# Patient Record
Sex: Male | Born: 1970 | Race: White | Hispanic: No | Marital: Married | Smoking: Never smoker
Health system: Southern US, Community
[De-identification: ages and names within clinical notes are randomized; demographics above are authoritative.]

## PROBLEM LIST (undated history)

## (undated) DIAGNOSIS — L509 Urticaria, unspecified: Secondary | ICD-10-CM

## (undated) DIAGNOSIS — R42 Dizziness and giddiness: Secondary | ICD-10-CM

## (undated) HISTORY — DX: Urticaria, unspecified: L50.9

## (undated) HISTORY — PX: VASECTOMY: SHX75

## (undated) HISTORY — DX: Dizziness and giddiness: R42

---

## 1999-01-15 ENCOUNTER — Encounter: Payer: Self-pay | Admitting: Emergency Medicine

## 1999-01-15 ENCOUNTER — Ambulatory Visit (HOSPITAL_COMMUNITY): Admission: RE | Admit: 1999-01-15 | Discharge: 1999-01-15 | Payer: Self-pay | Admitting: Emergency Medicine

## 1999-01-15 ENCOUNTER — Emergency Department (HOSPITAL_COMMUNITY): Admission: EM | Admit: 1999-01-15 | Discharge: 1999-01-15 | Payer: Self-pay | Admitting: Emergency Medicine

## 2003-12-28 ENCOUNTER — Ambulatory Visit (HOSPITAL_COMMUNITY): Admission: RE | Admit: 2003-12-28 | Discharge: 2003-12-28 | Payer: Self-pay | Admitting: Family Medicine

## 2011-10-31 ENCOUNTER — Other Ambulatory Visit: Payer: Self-pay | Admitting: Physician Assistant

## 2011-10-31 ENCOUNTER — Ambulatory Visit
Admission: RE | Admit: 2011-10-31 | Discharge: 2011-10-31 | Disposition: A | Discharge status: Home / Self Care | Payer: BC Managed Care – PPO | Source: Ambulatory Visit | Attending: Physician Assistant | Admitting: Physician Assistant

## 2011-11-28 ENCOUNTER — Ambulatory Visit
Admission: RE | Admit: 2011-11-28 | Discharge: 2011-11-28 | Disposition: A | Discharge status: Home / Self Care | Payer: BC Managed Care – PPO | Source: Ambulatory Visit | Attending: Physician Assistant | Admitting: Physician Assistant

## 2013-07-13 ENCOUNTER — Ambulatory Visit: Payer: Self-pay | Admitting: Interventional Cardiology

## 2013-10-05 IMAGING — CR DG CHEST 2V
2 series · 2 of 2 positions shown · non-contrast
Comparison: None.

CLINICAL DATA: Chest pain

CHEST - 2 VIEW

[view not recorded (1 of 2)]
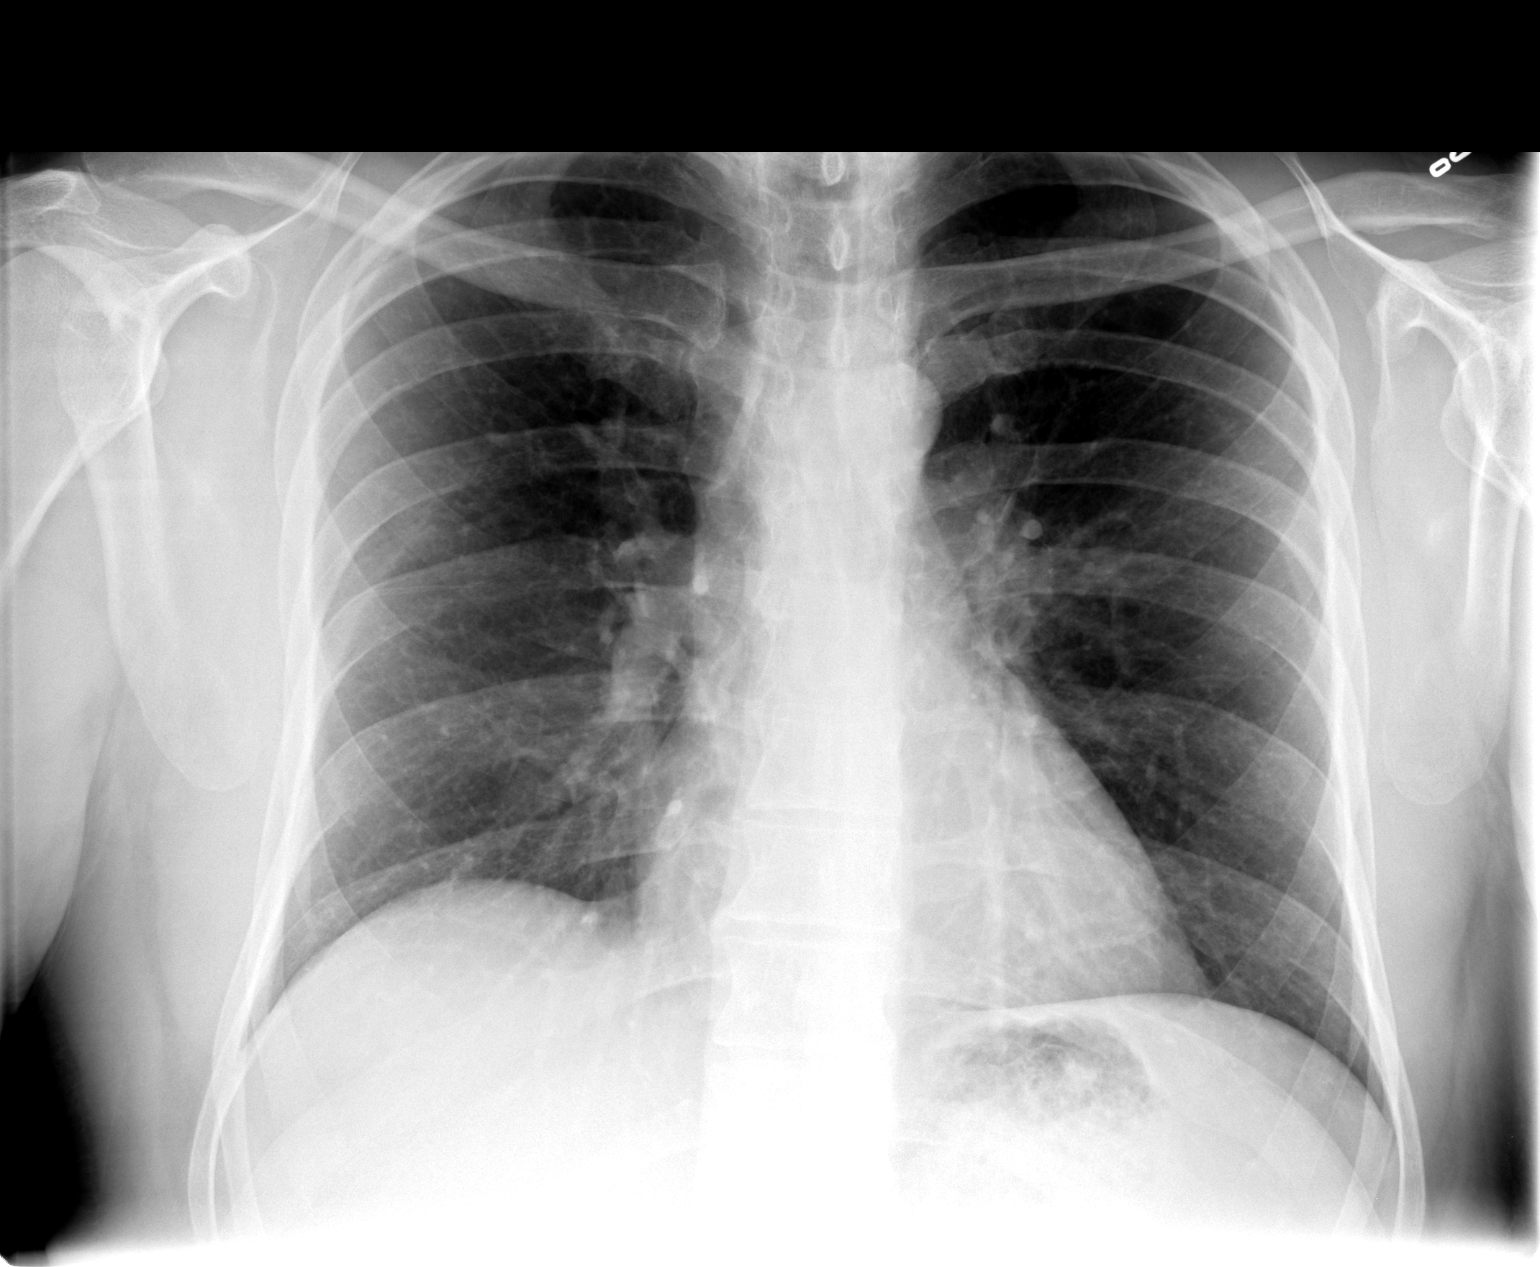

[view not recorded (2 of 2)]
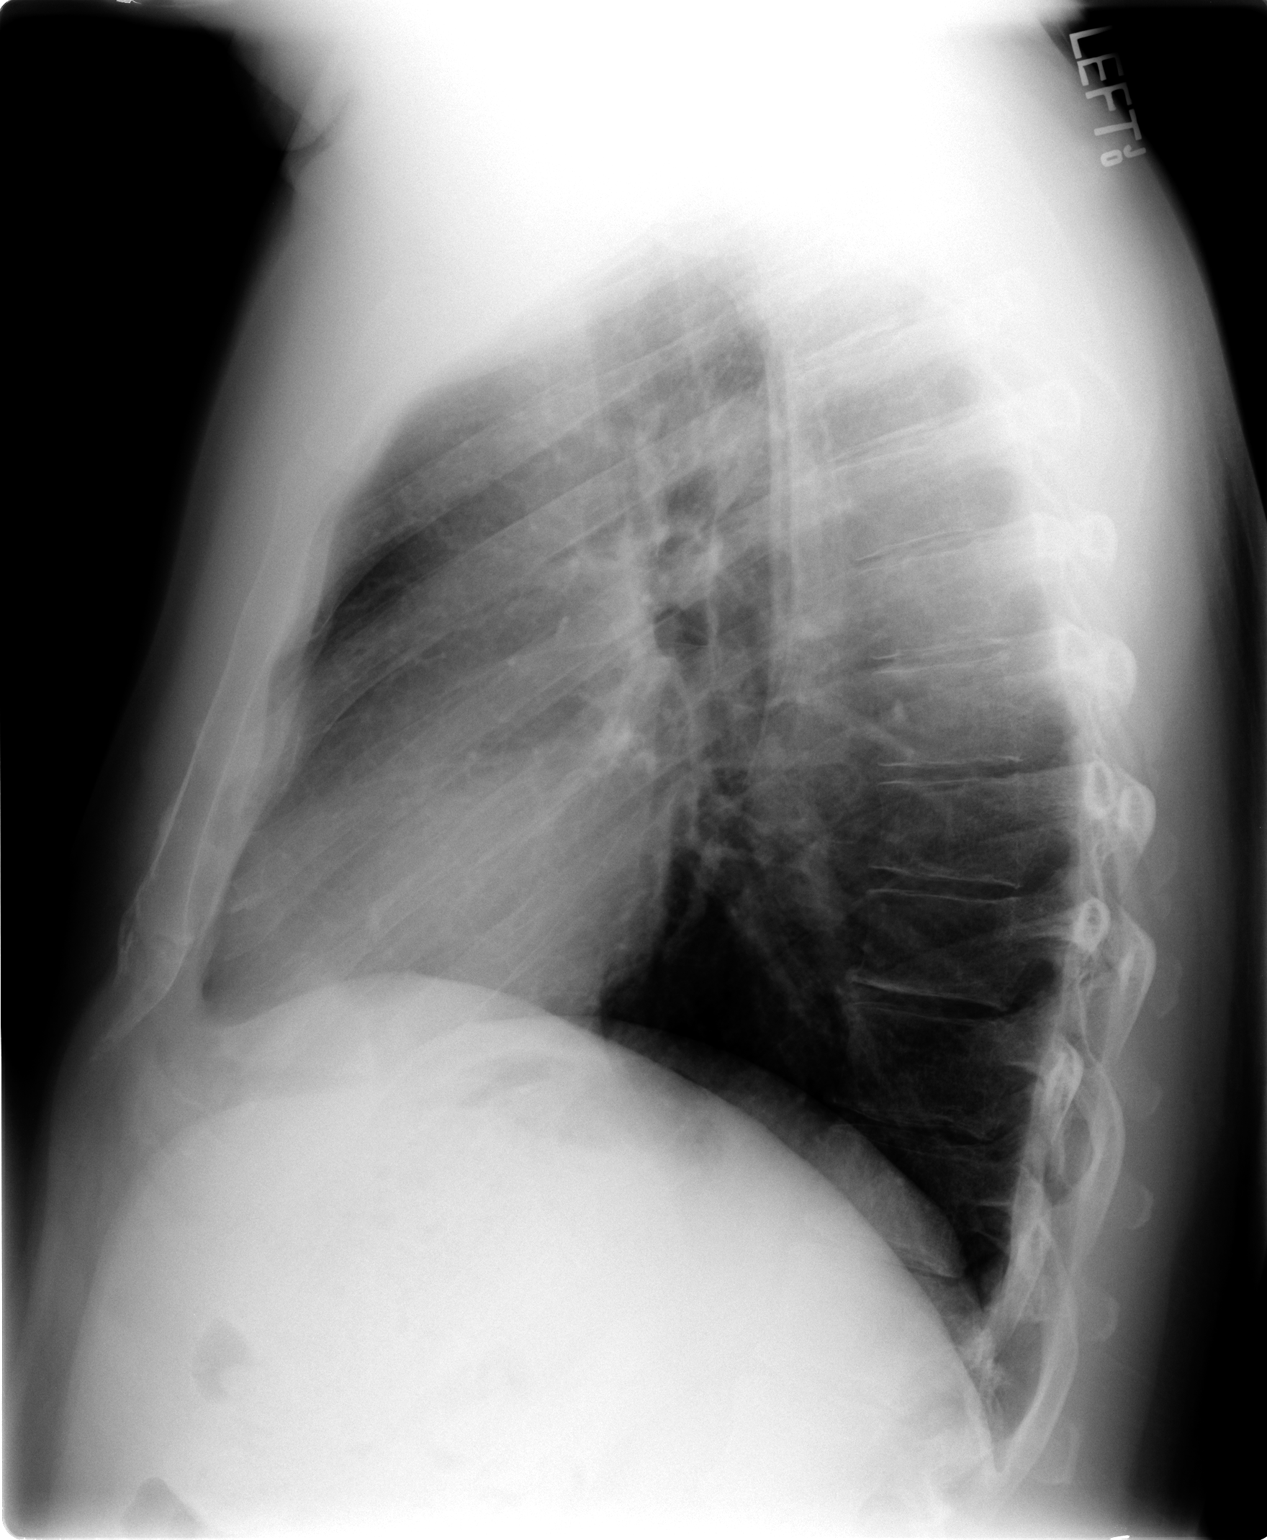

[2 of 2 positions shown; findings below may reference images not displayed]

FINDINGS: Heart and mediastinal contours are within normal limits.
The lung fields appear clear with no focal infiltrates or signs of
congestive failure noted. No pleural fluid or significant
peribronchial cuffing is seen.

Bony structures appear intact.
IMPRESSION: No worrisome focal or acute cardiopulmonary abnormalities seen.

## 2013-12-11 ENCOUNTER — Encounter: Payer: Self-pay | Admitting: *Deleted

## 2013-12-12 ENCOUNTER — Ambulatory Visit (INDEPENDENT_AMBULATORY_CARE_PROVIDER_SITE_OTHER): Payer: BC Managed Care – PPO | Admitting: Interventional Cardiology

## 2013-12-12 ENCOUNTER — Encounter: Payer: Self-pay | Admitting: Interventional Cardiology

## 2013-12-12 VITALS — BP 141/91 | HR 83 | Ht 74.0 in | Wt 235.0 lb

## 2013-12-12 DIAGNOSIS — R0789 Other chest pain: Secondary | ICD-10-CM

## 2013-12-12 DIAGNOSIS — R9431 Abnormal electrocardiogram [ECG] [EKG]: Secondary | ICD-10-CM | POA: Insufficient documentation

## 2013-12-12 DIAGNOSIS — E669 Obesity, unspecified: Secondary | ICD-10-CM | POA: Insufficient documentation

## 2013-12-12 NOTE — Progress Notes (Signed)
Patient ID: Tyler Kelley, male   DOB: 08-18-1971, [age] y.o.   MRN: [id number]   Date: 12/12/2013 ID: Tyler LombardWilliam B Kelley, DOB 08-18-1971, MRN [id number] PCP: Lolita PatellaEADE,ROBERT ALEXANDER, MD  Reason: Chest discomfort  ASSESSMENT;  1. Chest discomfort, atypical 2. Abnormal EKG with inferior Q waves and T-wave inversion  PLAN:  1. Stress Cardiolite 2. Aspirin 81 mg per day, coated   SUBJECTIVE: Tyler Kelley is a [age] y.o. male who is here for evaluation of a 3 year history of chest pain. 2 half years ago he had severe episodes of chest pain the last "seconds". The discomfort is described as a seizing-type pain that would make it stop in your tracts. He was evaluated a couple of times by his primary physician and no abnormalities were found. An EKG was performed in February 2013 net was normal. The discomfort gradually improved. More recently he has had a very mild/dull discomfort that occurs spontaneously and lasts several minutes before resolving. It is not exertion related. There is no associated orthopnea, PND, dyspnea, palpitation, or other complaints. Visit birth risk factors. He has never smoked, has no history of hypertension, hyperlipidemia, or significant family history of vascular disease.   Allergies  Allergen Reactions  . Azithromycin     Current Outpatient Prescriptions on File Prior to Visit  Medication Sig Dispense Refill  . diphenhydrAMINE (BENADRYL) 25 mg capsule Take 25 mg by mouth every 6 (six) hours as needed.      . loratadine (CLARITIN) 10 MG tablet Take 10 mg by mouth daily.       No current facility-administered medications on file prior to visit.    Past Medical History  Diagnosis Date  . Vertigo   . Urticaria     Past Surgical History  Procedure Laterality Date  . Vasectomy      History   Social History  . Marital Status: Single    Spouse Name: N/A    Number of Children: N/A  . Years of Education: N/A   Occupational History  . Not on file.    Social History Main Topics  . Smoking status: Never Smoker   . Smokeless tobacco: Not on file  . Alcohol Use: Yes  . Drug Use: No  . Sexual Activity: Not on file   Other Topics Concern  . Not on file   Social History Narrative  . No narrative on file    Family History  Problem Relation Age of Onset  . Cancer Mother   . Hypertension Father   . Cancer Maternal Grandmother     ROS: Denies palpitations, syncope, claudication, neurological complaints, abdominal pain, wheezing, asthma, blood clots in legs, pulmonary emboli, alcohol use, drug use, and anemia. Other systems negative for complaints.  OBJECTIVE: BP 141/91  Pulse 83  Ht 6\' 2"  (1.88 m)  Wt 235 lb (106.595 kg)  BMI 30.16 kg/m2,  General: No acute distress, mildly overweight, mostly abdominal HEENT: normal without jaundice or pallor Neck: JVD flat. Carotids absent bruits and 2+ upstroke Chest: Clear Cardiac: Murmur: Normal. Gallop: S4. Rhythm: Regular. Other: Normal Abdomen: Bruit: Absent. Pulsation: Absent Extremities: Edema: Absent. Pulses: 2+ and symmetric in the upper and lower extremities Neuro: Normal Psych: Normal  ECG: Abnormal with sinus rhythm inferior Q waves with inferior T-wave abnormality. This is a change compared with 2013

## 2013-12-12 NOTE — Patient Instructions (Signed)
Your physician recommends that you continue on your current medications as directed. Please refer to the Current Medication list given to you today.  Your physician has requested that you have en exercise stress myoview. For further information please visit [email or web address]. Please follow instruction sheet, as given.  Follow up pending stress test

## 2013-12-27 ENCOUNTER — Telehealth (HOSPITAL_COMMUNITY): Payer: Self-pay

## 2013-12-28 ENCOUNTER — Ambulatory Visit (HOSPITAL_COMMUNITY)
Admission: RE | Admit: 2013-12-28 | Discharge: 2013-12-28 | Disposition: A | Discharge status: Home / Self Care | Payer: BC Managed Care – PPO | Source: Ambulatory Visit | Attending: Interventional Cardiology | Admitting: Interventional Cardiology

## 2013-12-28 ENCOUNTER — Encounter (HOSPITAL_COMMUNITY): Payer: Self-pay

## 2013-12-28 DIAGNOSIS — R0789 Other chest pain: Secondary | ICD-10-CM

## 2013-12-28 DIAGNOSIS — R079 Chest pain, unspecified: Secondary | ICD-10-CM | POA: Insufficient documentation

## 2013-12-29 ENCOUNTER — Encounter: Payer: Self-pay | Admitting: Interventional Cardiology

## 2013-12-30 ENCOUNTER — Encounter (HOSPITAL_COMMUNITY): Payer: Self-pay

## 2013-12-30 ENCOUNTER — Telehealth: Payer: Self-pay | Admitting: Interventional Cardiology

## 2013-12-30 NOTE — Telephone Encounter (Signed)
returned pt call.lmom Dr.Entwistle will return to the office on monday pt gxt is on his desk top for review.once reviewed by him we will call with his recommendations

## 2013-12-30 NOTE — Telephone Encounter (Signed)
New message ° ° ° ° °Want stress test results °

## 2014-01-03 NOTE — Telephone Encounter (Signed)
returned pt call.pt given prelim gxt results.adv him I would call back with Dr.Mahany recommendations once reviewed by him.pt agreeable with plan and verbalized understanding.

## 2014-01-03 NOTE — Telephone Encounter (Signed)
Follow up ° ° ° ° °Want stress test results °

## 2014-01-04 ENCOUNTER — Telehealth: Payer: Self-pay

## 2014-01-04 NOTE — Telephone Encounter (Signed)
Message copied by Jarvis NewcomerPARRIS-GODLEY, Georgiana Spillane S on Wed Jan 04, 2014  2:36 PM ------      Message from: Verdis PrimeSMITH, HENRY      Created: Tue Jan 03, 2014  6:40 PM       Normal ETT . Notify if any further symptom. No evidence decreased blood flow. ------

## 2014-01-04 NOTE — Telephone Encounter (Signed)
pt given gxt results.Normal ETT . Notify if any further symptom. No evidence decreased blood flow..pt will f/u in 62mo recall in epic.pt verbalized understanding.

## 2014-03-15 NOTE — Telephone Encounter (Signed)
Encounter complete. 

## 2014-07-21 ENCOUNTER — Ambulatory Visit: Payer: Self-pay | Admitting: Interventional Cardiology

## 2014-09-18 ENCOUNTER — Ambulatory Visit (INDEPENDENT_AMBULATORY_CARE_PROVIDER_SITE_OTHER): Payer: BC Managed Care – PPO | Admitting: Interventional Cardiology

## 2014-09-18 ENCOUNTER — Encounter: Payer: Self-pay | Admitting: Interventional Cardiology

## 2014-09-18 VITALS — BP 130/82 | HR 82 | Ht 74.0 in | Wt 227.1 lb

## 2014-09-18 DIAGNOSIS — E669 Obesity, unspecified: Secondary | ICD-10-CM

## 2014-09-18 DIAGNOSIS — R0789 Other chest pain: Secondary | ICD-10-CM

## 2014-09-18 DIAGNOSIS — R9431 Abnormal electrocardiogram [ECG] [EKG]: Secondary | ICD-10-CM

## 2014-09-18 NOTE — Progress Notes (Signed)
Patient ID: Tyler Kelley, male   DOB: 09/03/1971, 44 y.o.   MRN: [id number]    [address] [place], [place]  [address] Phone: [phone] Fax:  [phone]  Date:  09/18/2014   ID:  Tyler Kelley, DOB December 14, 1970, MRN [id number]  PCP:  Lolita Patella, MD   ASSESSMENT:  1. Chronically abnormal-appearing EKG, with normal exercise treadmill test performed in April 2015 2. Mild obesity 3. Atypical chest pain  PLAN:  1. At this point there is no evidence of the patient has clinical coronary disease. No further testing is felt indicated.   SUBJECTIVE: Tyler Kelley is a 44 y.o. male who has no limitations in his had no cardiopulmonary complaints in the 9 months since I have seen him last. He did have an exercise treadmill test performed in April 2015 which was normal. He denies orthopnea, PND, edema, claudication, and neurological complaints.   Wt Readings from Last 3 Encounters:  09/18/14 227 lb 1.9 oz (103.021 kg)  12/12/13 235 lb (106.595 kg)     Past Medical History  Diagnosis Date  . Vertigo   . Urticaria     No current outpatient prescriptions on file.   No current facility-administered medications for this visit.    Allergies:    Allergies  Allergen Reactions  . Azithromycin     Social History:  The patient  reports that he has never smoked. He does not have any smokeless tobacco history on file. He reports that he drinks alcohol. He reports that he does not use illicit drugs.   ROS:  Please see the history of present illness.   No gastrointestinal cardiopulmonary complaints.   All other systems reviewed and negative.   OBJECTIVE: VS:  BP 130/82 mmHg  Pulse 82  Ht  (1.88 m)  Wt 227 lb 1.9 oz (103.021 kg)  BMI 29.15 kg/m2  SpO2 98% Well nourished, well developed, in no acute distress, mildly obese HEENT: normal Neck: JVD flat. Carotid bruit absent  Cardiac:  normal S1, S2; RRR; no murmur Lungs:  clear to auscultation  bilaterally, no wheezing, rhonchi or rales Abd: soft, nontender, no hepatomegaly Ext: Edema absent. Pulses 2+ Skin: warm and dry Neuro:  CNs 2-12 intact, no focal abnormalities noted  EKG:  Not repeated       Signed, Darci Needle III, MD 09/18/2014 9:07 AM

## 2014-09-18 NOTE — Patient Instructions (Signed)
Your physician recommends that you continue on your current medications as directed. Please refer to the Current Medication list given to you today.  Your physician wants you to follow-up in: 1 year with Dr.Brosh You will receive a reminder letter in the mail two months in advance. If you don't receive a letter, please call our office to schedule the follow-up appointment.  

## 2015-11-22 ENCOUNTER — Encounter: Payer: Self-pay | Admitting: Interventional Cardiology

## 2015-11-22 ENCOUNTER — Ambulatory Visit (INDEPENDENT_AMBULATORY_CARE_PROVIDER_SITE_OTHER): Payer: BLUE CROSS/BLUE SHIELD | Admitting: Interventional Cardiology

## 2015-11-22 VITALS — BP 130/86 | HR 75 | Ht 74.0 in | Wt 217.2 lb

## 2015-11-22 DIAGNOSIS — R0789 Other chest pain: Secondary | ICD-10-CM | POA: Diagnosis not present

## 2015-11-22 DIAGNOSIS — E669 Obesity, unspecified: Secondary | ICD-10-CM

## 2015-11-22 DIAGNOSIS — R9431 Abnormal electrocardiogram [ECG] [EKG]: Secondary | ICD-10-CM

## 2015-11-22 LAB — LIPID PANEL
CHOLESTEROL: 163 mg/dL (ref 125–200)
HDL: 41 mg/dL (ref >=40)
LDL Cholesterol: 73 mg/dL (ref <130)
TRIGLYCERIDES: 247 mg/dL — AB (ref <150)
Total CHOL/HDL Ratio: 4 Ratio (ref <=5.0)
VLDL: 49 mg/dL — ABNORMAL HIGH (ref <30)

## 2015-11-22 MED ORDER — ASPIRIN EC 81 MG PO TBEC
81.0000 mg | DELAYED_RELEASE_TABLET | Freq: Every day | ORAL | Status: DC
Start: 2015-11-22 — End: 2019-06-27

## 2015-11-22 NOTE — Addendum Note (Signed)
Addended by: Tonita PhoenixBOWDEN, Blimy Napoleon K on: 11/22/2015 01:02 PM   Modules accepted: Orders

## 2015-11-22 NOTE — Patient Instructions (Addendum)
Medication Instructions:  START Aspirin 81mg  daily  Labwork: Lipid today  Testing/Procedures: Dr.Kuhl recommends you have a CT Cardiac Calcium Score  Follow-Up: Your physician wants you to follow-up in: 1 year or as needed You will receive a reminder letter in the mail two months in advance. If you don't receive a letter, please call our office to schedule the follow-up appointment.   Any Other Special Instructions Will Be Listed Below (If Applicable).     If you need a refill on your cardiac medications before your next appointment, please call your pharmacy.

## 2015-11-22 NOTE — Progress Notes (Signed)
Cardiology Office Note   Date:  11/22/2015   ID:  Tyler Kelley, DOB 1970/10/02, MRN [id number]  PCP:  Vena Austria, MD  Cardiologist:  Sinclair Grooms, MD   Chief Complaint  Patient presents with  . Chest Pain      History of Present Illness: Tyler Kelley is a 45 y.o. male who presents for Follow-up of atypical chest pain and abnormal EKG.  Weight is doing well. He has much less chest discomfort that when I first met him 2 years ago. He is physically active. He has no complaints.     Past Medical History  Diagnosis Date  . Vertigo   . Urticaria     Past Surgical History  Procedure Laterality Date  . Vasectomy       Current Outpatient Prescriptions  Medication Sig Dispense Refill  . aspirin EC 81 MG tablet Take 1 tablet (81 mg total) by mouth daily.     No current facility-administered medications for this visit.    Allergies:   Azithromycin    Social History:  The patient  reports that he has never smoked. He has never used smokeless tobacco. He reports that he drinks alcohol. He reports that he does not use illicit drugs.   Family History:  The patient's family history includes Cancer in his maternal grandmother and mother; Hypertension in his father.    ROS:  Please see the history of present illness.   Otherwise, review of systems are positive for Mild recurring episodes of chest pain..   All other systems are reviewed and negative.    PHYSICAL EXAM: VS:  BP 130/86 mmHg  Pulse 75  Ht 6' 2"  (1.88 m)  Wt 217 lb 3.2 oz (98.521 kg)  BMI 27.87 kg/m2 , BMI Body mass index is 27.87 kg/(m^2). GEN: Well nourished, well developed, in no acute distress HEENT: normal Neck: no JVD, carotid bruits, or masses Cardiac: RRR.  There is no murmur, rub, or gallop. There is no edema. Respiratory:  clear to auscultation bilaterally, normal work of breathing. GI: soft, nontender, nondistended, + BS MS: no deformity or atrophy Skin: warm and dry, no  rash Neuro:  Strength and sensation are intact Psych: euthymic mood, full affect   EKG:  EKG is ordered today. The ekg reveals normal   Recent Labs: No results found for requested labs within last 365 days.    Lipid Panel No results found for: CHOL, TRIG, HDL, CHOLHDL, VLDL, LDLCALC, LDLDIRECT    Wt Readings from Last 3 Encounters:  11/22/15 217 lb 3.2 oz (98.521 kg)  09/18/14 227 lb 1.9 oz (103.021 kg)  12/12/13 235 lb (106.595 kg)      Other studies Reviewed: Additional studies/ records that were reviewed today include: Reviewed the exercise treadmill test and we performed a year ago which was unremarkable. He exercises 12 minutes without evidence of ischemia.. The findings include ECG on initial visit demonstrated inferior T-wave inversion with Q waves in lead III and aVF.Marland Kitchen    ASSESSMENT AND PLAN:  1. Chest discomfort Atypical. Moderate risk due to family history. Maximal exercise treadmill test normal one year ago.  2. Abnormal EKG Normalized  3. Obese Improved    Current medicines are reviewed at length with the patient today.  The patient has the following concerns regarding medicines: None.  The following changes/actions have been instituted:    Coronary calcium score  Lipid panel  Treat lipids if significantly elevated  Labs/ tests ordered  today include:   Orders Placed This Encounter  Procedures  . CT Cardiac Scoring  . Lipid panel  . EKG 12-Lead     Disposition:   FU with HS in 1 year  Signed, Sinclair Grooms, MD  11/22/2015 12:56 PM    [address], [place], [place]  [address] Phone: [phone]; Fax: [phone]

## 2015-11-27 ENCOUNTER — Ambulatory Visit (INDEPENDENT_AMBULATORY_CARE_PROVIDER_SITE_OTHER)
Admission: RE | Admit: 2015-11-27 | Discharge: 2015-11-27 | Disposition: A | Discharge status: Home / Self Care | Payer: Self-pay | Source: Ambulatory Visit | Attending: Interventional Cardiology | Admitting: Interventional Cardiology

## 2015-11-27 DIAGNOSIS — R0789 Other chest pain: Secondary | ICD-10-CM

## 2015-11-27 DIAGNOSIS — R9431 Abnormal electrocardiogram [ECG] [EKG]: Secondary | ICD-10-CM

## 2015-12-03 ENCOUNTER — Other Ambulatory Visit: Payer: Self-pay

## 2015-12-03 DIAGNOSIS — E785 Hyperlipidemia, unspecified: Secondary | ICD-10-CM

## 2017-01-04 NOTE — Progress Notes (Signed)
Cardiology Office Note    Date:  01/05/2017   ID:  LYNNE RIGHI, DOB December 08, 1970, MRN [id number]  PCP:  Lolita Patella, MD  Cardiologist: Lesleigh Noe, MD   Chief Complaint  Patient presents with  . Chest Pain    History of Present Illness:  Tyler Kelley is a 46 y.o. male who presents for Follow-up of atypical chest pain and abnormal EKG.  Still has spontaneous episodes of chest discomfort that lasts seconds and are severe. Unpredictable in occurrence but not precipitated by physical activity. Stable to bike and engage in physical activities without great difficulty.  Past Medical History:  Diagnosis Date  . Urticaria   . Vertigo     Past Surgical History:  Procedure Laterality Date  . VASECTOMY      Current Medications: Outpatient Medications Prior to Visit  Medication Sig Dispense Refill  . aspirin EC 81 MG tablet Take 1 tablet (81 mg total) by mouth daily.     No facility-administered medications prior to visit.      Allergies:   Azithromycin   Social History   Social History  . Marital status: Single    Spouse name: N/A  . Number of children: N/A  . Years of education: N/A   Social History Main Topics  . Smoking status: Never Smoker  . Smokeless tobacco: Never Used  . Alcohol use 0.0 oz/week  . Drug use: No  . Sexual activity: Not Asked   Other Topics Concern  . None   Social History Narrative  . None     Family History:  The patient's family history includes Cancer in his maternal grandmother and mother; Hypertension in his father.   ROS:   Please see the history of present illness.    Blood pressure is been a little elevated. Occasional dizziness and sinus difficulty.  All other systems reviewed and are negative.   PHYSICAL EXAM:   VS:  BP 132/90 (BP Location: Left Arm)   Pulse 74   Ht  (1.88 m)   Wt 218 lb (98.9 kg)   BMI 27.99 kg/m    GEN: Well nourished, well developed, in no acute distress  HEENT: normal    Neck: no JVD, carotid bruits, or masses Cardiac: RRR; no murmurs, rubs, or gallops,no edema  Respiratory:  clear to auscultation bilaterally, normal work of breathing GI: soft, nontender, nondistended, + BS MS: no deformity or atrophy  Skin: warm and dry, no rash Neuro:  Alert and Oriented x 3, Strength and sensation are intact Psych: euthymic mood, full affect  Wt Readings from Last 3 Encounters:  01/05/17 218 lb (98.9 kg)  11/22/15 217 lb 3.2 oz (98.5 kg)  09/18/14 227 lb 1.9 oz (103 kg)      Studies/Labs Reviewed:   EKG:  EKG  Normal sinus rhythm with normal overall appearance  Recent Labs: No results found for requested labs within last 8760 hours.   Lipid Panel    Component Value Date/Time   CHOL 163 11/22/2015 1302   TRIG 247 (H) 11/22/2015 1302   HDL 41 11/22/2015 1302   CHOLHDL 4.0 11/22/2015 1302   VLDL 49 (H) 11/22/2015 1302   LDLCALC 73 11/22/2015 1302    Additional studies/ records that were reviewed today include:  Coronary calcium score 11/27/15 IMPRESSION: Coronary calcium score of 0. Charlton Haws   ASSESSMENT:    1. Chest discomfort   2. Abnormal EKG   3. Transient elevated blood pressure  PLAN:  In order of problems listed above:  1. Atypical and not felt to represent cardiac etiology. Coronary calcium score is 0. Normal exercise treadmill testing 2015. No documentation of heart disease. 2. EKG is currently normal. 3. Blood pressure target is 140/90 mmHg or less. Recommended that he check his blood pressure 2-3 times per year. Clinical follow-up as needed. Weight loss, aerobic physical activity, and 2 g sodium diet or recommended.  Liver healthy active lifestyle with planned based diet. Clinical follow-up in one year. No medical therapy needed at this time.  Medication Adjustments/Labs and Tests Ordered: Current medicines are reviewed at length with the patient today.  Concerns regarding medicines are outlined above.  Medication  changes, Labs and Tests ordered today are listed in the Patient Instructions below. Patient Instructions  Medication Instructions:  None  Labwork: None  Testing/Procedures: None  Follow-Up: Your physician wants you to follow-up in: 1 year with Dr. Katrinka Blazing.  You will receive a reminder letter in the mail two months in advance. If you don't receive a letter, please call our office to schedule the follow-up appointment.   Any Other Special Instructions Will Be Listed Below (If Applicable).     If you need a refill on your cardiac medications before your next appointment, please call your pharmacy.      Signed, Lesleigh Noe, MD  01/05/2017 8:59 AM    [facility] [address] [place], [place], [place]  [address] Phone: [phone]; Fax: [phone]

## 2017-01-05 ENCOUNTER — Ambulatory Visit (INDEPENDENT_AMBULATORY_CARE_PROVIDER_SITE_OTHER): Payer: PRIVATE HEALTH INSURANCE | Admitting: Interventional Cardiology

## 2017-01-05 ENCOUNTER — Encounter: Payer: Self-pay | Admitting: Interventional Cardiology

## 2017-01-05 VITALS — BP 132/90 | HR 74 | Ht 74.0 in | Wt 218.0 lb

## 2017-01-05 DIAGNOSIS — R030 Elevated blood-pressure reading, without diagnosis of hypertension: Secondary | ICD-10-CM | POA: Diagnosis not present

## 2017-01-05 DIAGNOSIS — R0789 Other chest pain: Secondary | ICD-10-CM | POA: Diagnosis not present

## 2017-01-05 DIAGNOSIS — R9431 Abnormal electrocardiogram [ECG] [EKG]: Secondary | ICD-10-CM | POA: Diagnosis not present

## 2017-01-05 NOTE — Patient Instructions (Signed)
Medication Instructions:  None  Labwork: None  Testing/Procedures: None  Follow-Up: Your physician wants you to follow-up in: 1 year with Dr. Loewenstein.  You will receive a reminder letter in the mail two months in advance. If you don't receive a letter, please call our office to schedule the follow-up appointment.   Any Other Special Instructions Will Be Listed Below (If Applicable).     If you need a refill on your cardiac medications before your next appointment, please call your pharmacy.   

## 2017-10-04 IMAGING — CT CT HEART SCORING
2 series · 16 of 20 positions shown, 18 images · non-contrast
Comparison: None.

CLINICAL DATA: Risk stratification

EXAM:
Coronary Calcium Score
TECHNIQUE: The patient was scanned on a Siemens Somatom Definition AS 64 slice
scanner. Axial non-contrast 3mm slices were carried out through the
heart. The data set was analyzed on a dedicated work station and
scored using the Agatson method.

[Series 2: casc 3.0 i36f 2 bestdiast 71 % · axial · 0.40mm/px · z∈[-245,-140]mm · 8 of 45 slices shown, 10 images]
[im 5/45  vessel]
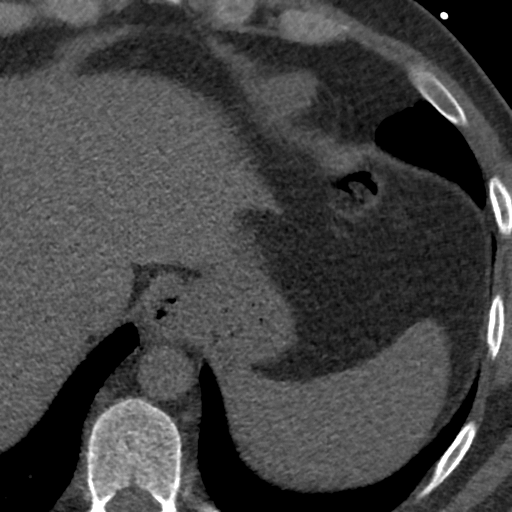
[im 5/45  lung]
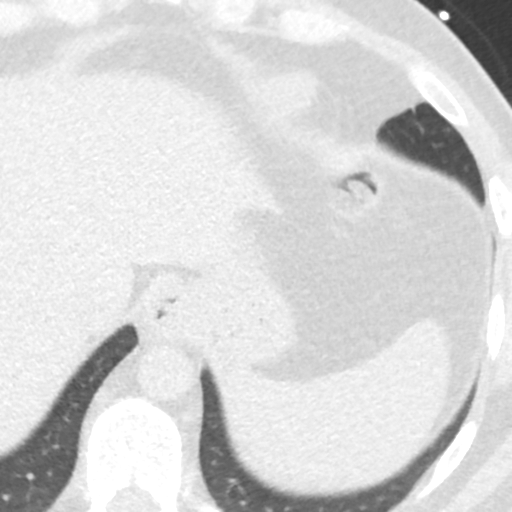
[im 10/45  vessel]
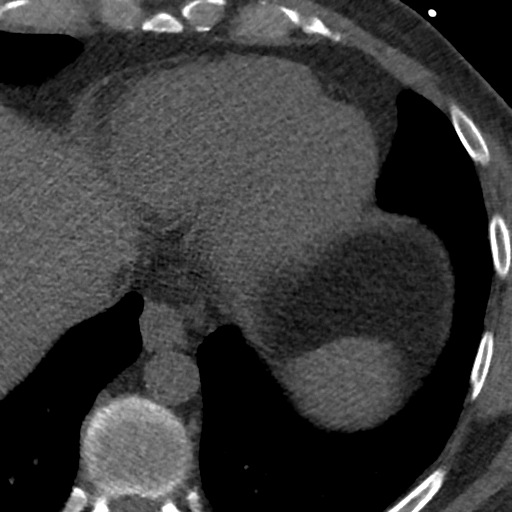
[im 15/45  vessel]
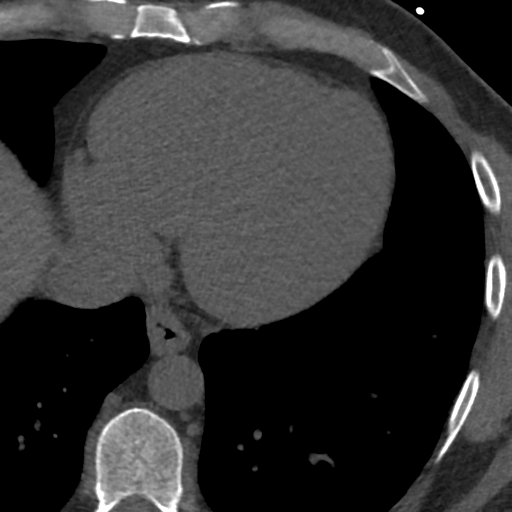
[im 20/45  vessel]
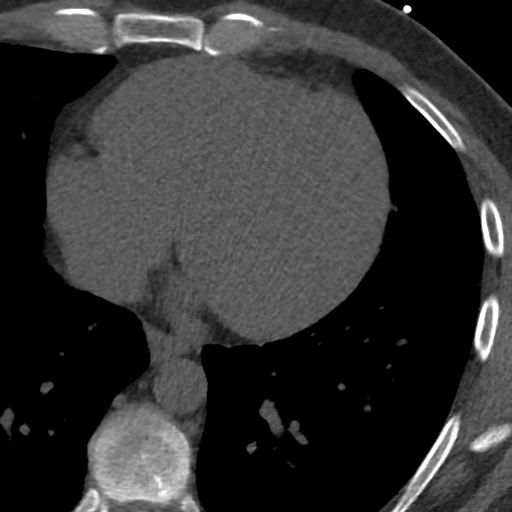
[im 25/45  vessel]
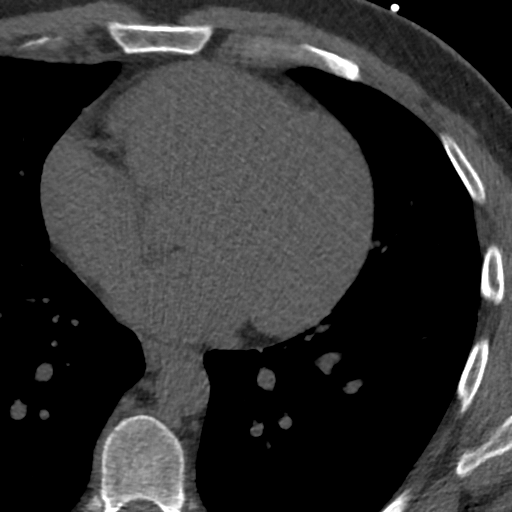
[im 25/45  lung]
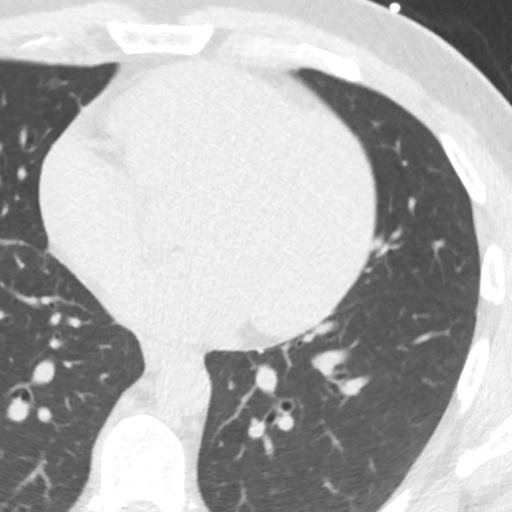
[im 30/45  vessel]
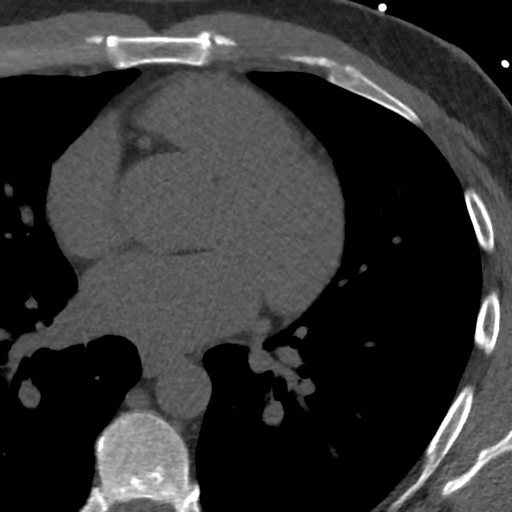
[im 35/45  vessel]
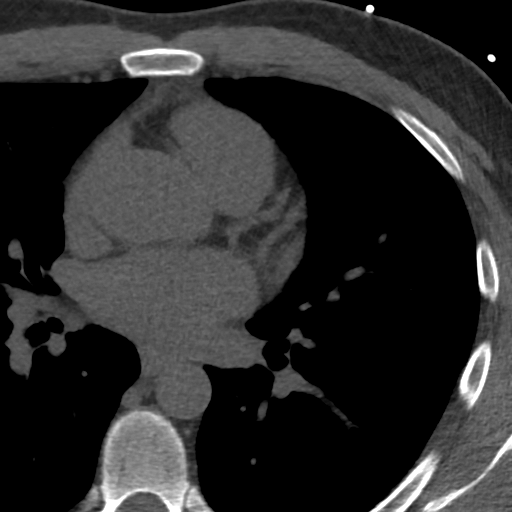
[im 40/45  vessel]
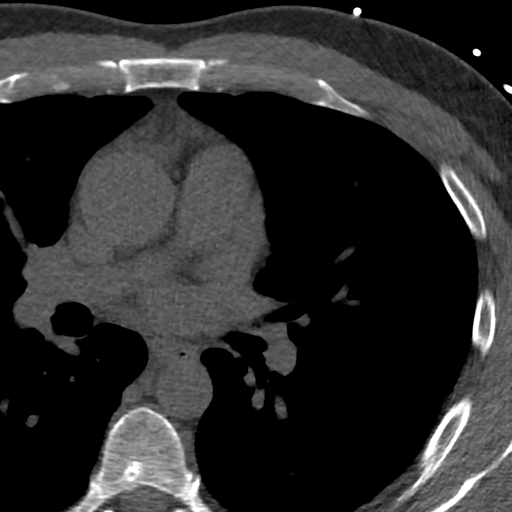

[Series 4: lung st 71 % · axial · 0.70mm/px · z∈[-245,-140]mm · 8 of 45 slices shown]
[im 5/45  lung]
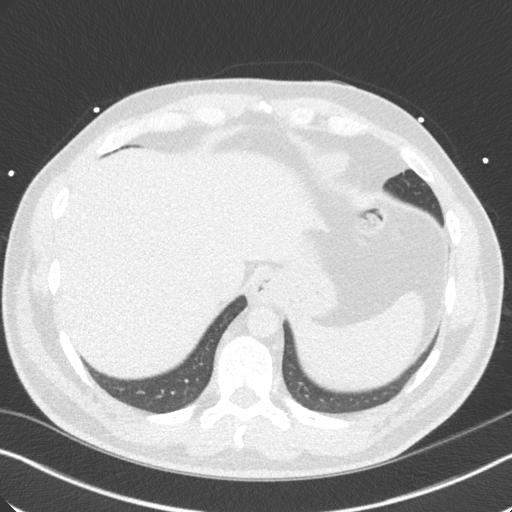
[im 10/45  lung]
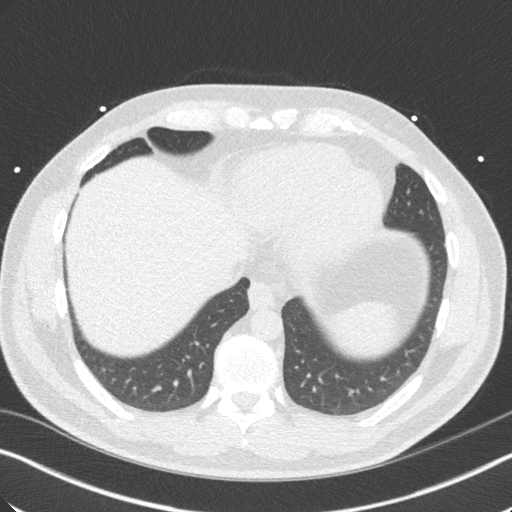
[im 15/45  lung]
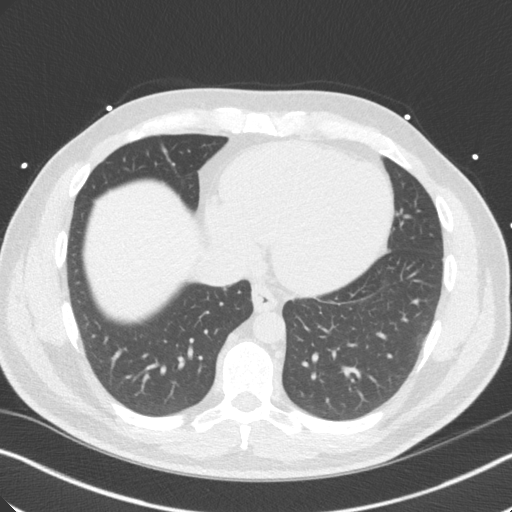
[im 20/45  lung]
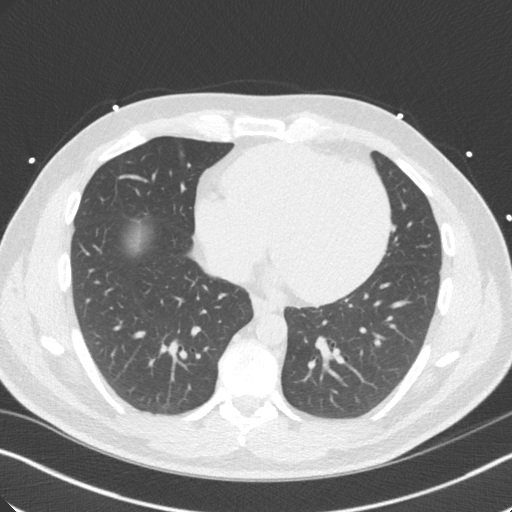
[im 25/45  lung]
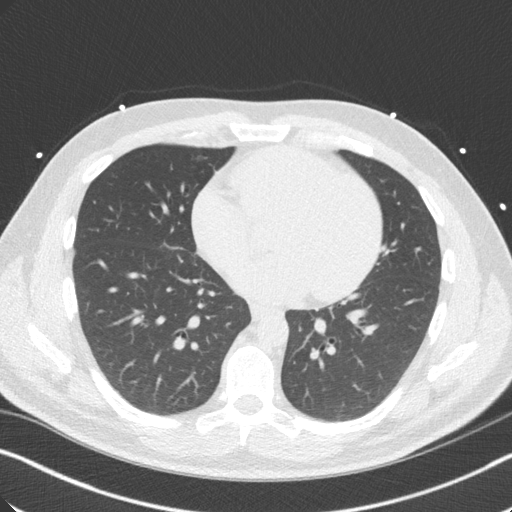
[im 30/45  lung]
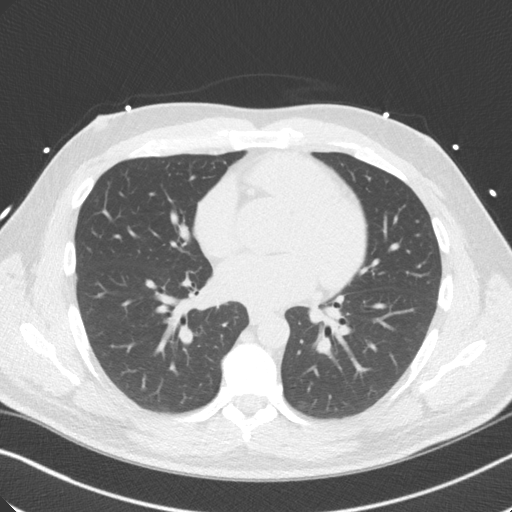
[im 35/45  lung]
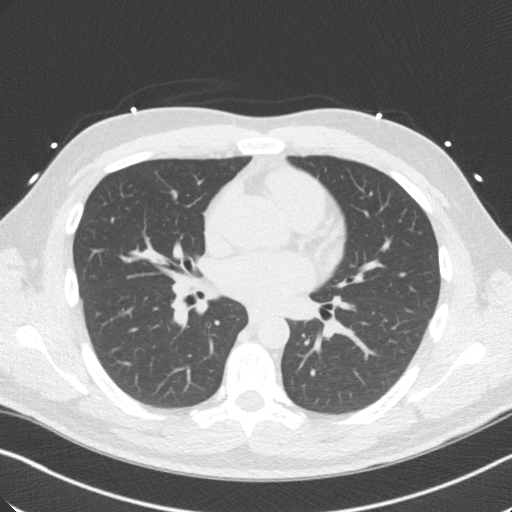
[im 40/45  lung]
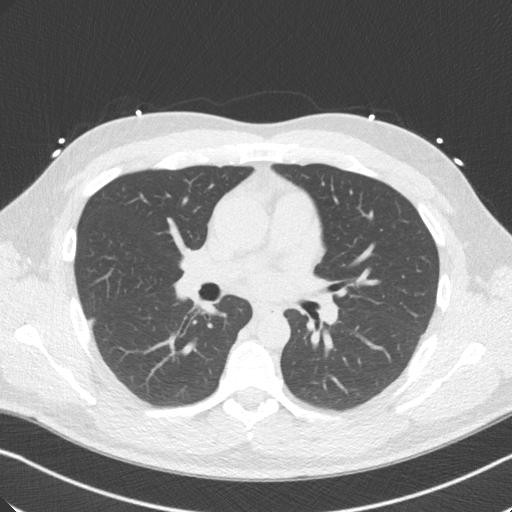

[16 of 20 positions shown; findings below may reference images not displayed]

FINDINGS: Non-cardiac: No significant non cardiac findings on limited lung and
soft tissue windows. See separate report from [REDACTED].

Ascending Aorta:  3.7 cm

Pericardium: Normal

Coronary arteries:  No calcium detected
IMPRESSION: Coronary calcium score of 0.

Meekko Richerm

EXAM:
OVER-READ INTERPRETATION  CT CHEST

The following report is an over-read performed by radiologist Dr.
does not include interpretation of cardiac or coronary anatomy or
pathology. The coronary calcium score interpretation by the
cardiologist is attached.
FINDINGS: The visualized portions of the lungs are clear. The visualized
portions of the mediastinum and chest wall are unremarkable.
IMPRESSION: No significant non-cardiac abnormality seen in visualized portion of
the thorax.

## 2018-06-03 ENCOUNTER — Encounter: Payer: Self-pay | Admitting: Interventional Cardiology

## 2018-06-16 NOTE — Progress Notes (Signed)
Cardiology Office Note:    Date:  06/17/2018   ID:  Tyler Kelley, DOB 04/21/1971, MRN [id number]  PCP:  Elias Else, MD  Cardiologist:  No primary care provider on file.   Referring MD: Elias Else, MD   Chief Complaint  Patient presents with  . Coronary Artery Disease    History of Present Illness:    Tyler Kelley is a 47 y.o. male with a hx of of atypical chest pain and abnormal EKG..  Is doing well.  Atypical chest pain as before.  Not exercising.  Gained weight.  Not paying attention to his diet.  Has 7 children and most of his activity is around their activities rather than his own personal health.  He denies shortness of breath, ankle swelling, orthopnea, and PND.  No palpitations.   Past Medical History:  Diagnosis Date  . Urticaria   . Vertigo     Past Surgical History:  Procedure Laterality Date  . VASECTOMY      Current Medications: Current Meds  Medication Sig  . aspirin EC 81 MG tablet Take 1 tablet (81 mg total) by mouth daily.  . sildenafil (VIAGRA) 50 MG tablet Take as directed.     Allergies:   Azithromycin   Social History   Socioeconomic History  . Marital status: Single    Spouse name: Not on file  . Number of children: Not on file  . Years of education: Not on file  . Highest education level: Not on file  Occupational History  . Not on file  Social Needs  . Financial resource strain: Not on file  . Food insecurity:    Worry: Not on file    Inability: Not on file  . Transportation needs:    Medical: Not on file    Non-medical: Not on file  Tobacco Use  . Smoking status: Never Smoker  . Smokeless tobacco: Never Used  Substance and Sexual Activity  . Alcohol use: Yes    Alcohol/week: 0.0 standard drinks  . Drug use: No  . Sexual activity: Not on file  Lifestyle  . Physical activity:    Days per week: Not on file    Minutes per session: Not on file  . Stress: Not on file  Relationships  . Social connections:    Talks  on phone: Not on file    Gets together: Not on file    Attends religious service: Not on file    Active member of club or organization: Not on file    Attends meetings of clubs or organizations: Not on file    Relationship status: Not on file  Other Topics Concern  . Not on file  Social History Narrative  . Not on file     Family History: The patient's family history includes Cancer in his maternal grandmother and mother; Hypertension in his father.  ROS:   Please see the history of present illness.    Back discomfort, headaches occasionally, sleeps well.  All other systems reviewed and are negative.  EKGs/Labs/Other Studies Reviewed:    The following studies were reviewed today: Data  EKG:  EKG is  ordered today.  The ekg ordered today demonstrates sinus rhythm with left axis deviation and prominent voltage.  No change from 1 year ago.  No alarms.  Recent Labs: No results found for requested labs within last 8760 hours.  Recent Lipid Panel    Component Value Date/Time   CHOL 163 11/22/2015 1302  TRIG 247 (H) 11/22/2015 1302   HDL 41 11/22/2015 1302   CHOLHDL 4.0 11/22/2015 1302   VLDL 49 (H) 11/22/2015 1302   LDLCALC 73 11/22/2015 1302    Physical Exam:    VS:  BP 134/90   Pulse 73   Ht 6\' 2"  (1.88 m)   Wt 235 lb (106.6 kg)   BMI 30.17 kg/m     Wt Readings from Last 3 Encounters:  06/17/18 235 lb (106.6 kg)  01/05/17 218 lb (98.9 kg)  11/22/15 217 lb 3.2 oz (98.5 kg)     GEN:  Well nourished, well developed in no acute distress HEENT: Normal NECK: No JVD. LYMPHATICS: No lymphadenopathy CARDIAC: RRR, no murmur, no S4 gallop, no edema. VASCULAR: No pulses. no bruits. RESPIRATORY:  Clear to auscultation without rales, wheezing or rhonchi  ABDOMEN: Soft, non-tender, non-distended, No pulsatile mass, MUSCULOSKELETAL: No deformity  SKIN: Warm and dry NEUROLOGIC:  Alert and oriented x 3 PSYCHIATRIC:  Normal affect   ASSESSMENT:    1. Chest discomfort     2. Mixed hyperlipidemia   3. Abnormal EKG   4. Essential hypertension    PLAN:    In order of problems listed above:  1. Continues to have atypical chest discomfort.  No change from prior.  Not exertional related. 2. We discussed LDL cholesterol and goal of less than 100. 3. EKG is now basically normal appearing 4. Blood pressure diastolic is mildly elevated.  We discussed weight loss, and aerobic activity 150 minutes/week, and weight loss.  I advised that he purchase a a digital blood pressure monitor and to notify us if chronically elevated above 140/90 mmHg.     Medication Adjustments/Labs and Tests Ordered: Current medicines are reviewed at length with the patient today.  Concerns regarding medicines are outlined above.  Orders Placed This Encounter  Procedures  . EKG 12-Lead   No orders of the defined types were placed in this encounter.   Patient Instructions  Medication Instructions:  Your physician recommends that you continue on your current medications as directed. Please refer to the Current Medication list given to you today.  Labwork: None  Testing/Procedures: None  Follow-Up: Your physician wants you to follow-up in: 1 year with Dr. Katrinka Blazing.  You will receive a reminder letter in the mail two months in advance. If you don't receive a letter, please call our office to schedule the follow-up appointment.   Any Other Special Instructions Will Be Listed Below (If Applicable).     If you need a refill on your cardiac medications before your next appointment, please call your pharmacy.      Signed, Lesleigh Noe, MD  06/17/2018 4:18 PM    [facility]

## 2018-06-17 ENCOUNTER — Ambulatory Visit: Payer: PRIVATE HEALTH INSURANCE | Admitting: Interventional Cardiology

## 2018-06-17 ENCOUNTER — Encounter: Payer: Self-pay | Admitting: Interventional Cardiology

## 2018-06-17 VITALS — BP 134/90 | HR 73 | Ht 74.0 in | Wt 235.0 lb

## 2018-06-17 DIAGNOSIS — E782 Mixed hyperlipidemia: Secondary | ICD-10-CM

## 2018-06-17 DIAGNOSIS — I10 Essential (primary) hypertension: Secondary | ICD-10-CM

## 2018-06-17 DIAGNOSIS — R9431 Abnormal electrocardiogram [ECG] [EKG]: Secondary | ICD-10-CM

## 2018-06-17 DIAGNOSIS — R0789 Other chest pain: Secondary | ICD-10-CM | POA: Diagnosis not present

## 2018-06-17 NOTE — Patient Instructions (Signed)
Medication Instructions:  Your physician recommends that you continue on your current medications as directed. Please refer to the Current Medication list given to you today.   Labwork: None   Testing/Procedures: None   Follow-Up: Your physician wants you to follow-up in 1 year with Dr. Pinney. You will receive a reminder letter in the mail two months in advance. If you don't receive a letter, please call our office to schedule the follow-up appointment.   Any Other Special Instructions Will Be Listed Below (If Applicable).     If you need a refill on your cardiac medications before your next appointment, please call your pharmacy.   

## 2019-06-23 NOTE — Progress Notes (Signed)
CARDIOLOGY OFFICE NOTE  Date:  06/27/2019    Margaretmary Lombard Date of Birth: 07-24-1971 Medical Record #[id number]  PCP:  Elias Else, MD  Cardiologist:  Katrinka Blazing  Chief Complaint  Patient presents with  . Follow-up    History of Present Illness: Tyler Kelley is a 48 y.o. male who presents today for a one year check. Seen for Dr. Katrinka Blazing.   He has a history of atypical chest pain and a prior abnormal EKG.   He was last seen a year ago by Dr. Katrinka Blazing and was doing well. Still with atypical chest pain - nothing exertional - he was not exercising - gaining weight - not paying attention to his diet, etc.   The patient does not have symptoms concerning for COVID-19 infection (fever, chills, cough, or new shortness of breath).   Comes in today. Here alone. He wanted to come in and "get checked out". He is doing well. Still with his chronic chest pain that he has had for years - nothing exertional - he feels like it is "trapped gas". He has lost weight - he was a little surprised - has been more active with the stay at home orders. Very busy home life - they have 7 kids - 5 still at home. He has been out of his Valsartan 160 mg for over a week. BP seems to be ok. He does not wish to stay on medicine if not needed. He does like salt.   Past Medical History:  Diagnosis Date  . Urticaria   . Vertigo     Past Surgical History:  Procedure Laterality Date  . VASECTOMY       Medications: Current Meds  Medication Sig  . ketoconazole (NIZORAL) 2 % cream APPLY 1 APPLICATION TWICE A DAY EXTERNALLY 30 DAYS  . sildenafil (REVATIO) 20 MG tablet Take one tablet by mouth one hour prior to sex as needed. Do not take more than one dose every 24 hours.  . valsartan (DIOVAN) 160 MG tablet Take 160 mg by mouth every morning.     Allergies: Allergies  Allergen Reactions  . Azithromycin Nausea Only    Social History: The patient  reports that he has never smoked. He has never used  smokeless tobacco. He reports current alcohol use. He reports that he does not use drugs.   Family History: The patient's family history includes Cancer in his maternal grandmother and mother; Hypertension in his father.   Review of Systems: Please see the history of present illness.   All other systems are reviewed and negative.   Physical Exam: VS:  BP 138/80   Pulse 66   Ht 6\' 2"  (1.88 m)   Wt 227 lb 6.4 oz (103.1 kg)   SpO2 98%   BMI 29.20 kg/m  .  BMI Body mass index is 29.2 kg/m.  Wt Readings from Last 3 Encounters:  06/27/19 227 lb 6.4 oz (103.1 kg)  06/17/18 235 lb (106.6 kg)  01/05/17 218 lb (98.9 kg)    General: Alert and in no acute distress.  His weight is down 8 pounds since last visit.  HEENT: Normal.  Neck: Supple, no JVD, carotid bruits, or masses noted.  Cardiac: Regular rate and rhythm. No murmurs, rubs, or gallops. No edema.  Respiratory:  Lungs are clear to auscultation bilaterally with normal work of breathing.  GI: Soft and nontender.  MS: No deformity or atrophy. Gait and ROM intact.  Skin: Warm and dry. Color  is normal.  Neuro:  Strength and sensation are intact and no gross focal deficits noted.  Psych: Alert, appropriate and with normal affect.   LABORATORY DATA:  EKG:  EKG is ordered today. This demonstrates NSR.  Lab Results  Component Value Date   CHOL 163 11/22/2015   TRIG 247 (H) 11/22/2015   HDL 41 11/22/2015   LDLCALC 73 11/22/2015       BNP (last 3 results) No results for input(s): BNP in the last 8760 hours.  ProBNP (last 3 results) No results for input(s): PROBNP in the last 8760 hours.   Other Studies Reviewed Today:  Coronary Calcium Score 11/2015 TECHNIQUE: The patient was scanned on a Siemens Somatom Definition AS 64 slice scanner. Axial non-contrast 67mm slices were carried out through the heart. The data set was analyzed on a dedicated work station and scored using the [employer]. FINDINGS: Non-cardiac: No  significant non cardiac findings on limited lung and soft tissue windows. See separate report from [facility]. Ascending Aorta:  3.7 cm Pericardium: Normal Coronary arteries:  No calcium detected IMPRESSION: Coronary calcium score of 0. Jenkins Rouge Electronically Signed   By: Jenkins Rouge M.D.   On: 11/27/2015 12:05   Notes Recorded by Sinclair Grooms, MD on 01/03/2014 at 6:40 PM  Normal ETT . Notify if any further symptom. No evidence decreased blood flow.    Assessment/Plan:  1. Chronic atypical chest pain - unchanged - not exertional - he continues with CV risk factor modification.   2. HTN - was on Valsartan 160 mg - has been out for over a week - does not wish to restart - he has lost weight. Will monitor over the next few weeks and then send [place] his readings for review. He needs to continue to be successful with weight loss and needs salt restriction.   3. HLD - prior calcium score of 0 back in 2017. Lipids from January noted.    4. Previously abnormal EKG - EKG today is ok.   5. COVID-19 Education: The signs and symptoms of COVID-19 were discussed with the patient and how to seek care for testing (follow up with PCP or arrange E-visit).  The importance of social distancing, staying at home, hand hygiene and wearing a mask when out in public were discussed today.  Current medicines are reviewed with the patient today.  The patient does not have concerns regarding medicines other than what has been noted above.  The following changes have been made:  See above.  Labs/ tests ordered today include:    Orders Placed This Encounter  Procedures  . EKG 12-Lead     Disposition:   FU with [place] in a year. He will be sending in a BP diary for review.    Patient is agreeable to this plan and will call if any problems develop in the interim.   SignedTruitt Merle, NP  06/27/2019 12:06 PM  [facility] [address] [place]  [place], [place]  [address] Phone: [phone] Fax: [phone]

## 2019-06-27 ENCOUNTER — Other Ambulatory Visit: Payer: Self-pay

## 2019-06-27 ENCOUNTER — Encounter: Payer: Self-pay | Admitting: Nurse Practitioner

## 2019-06-27 ENCOUNTER — Ambulatory Visit (INDEPENDENT_AMBULATORY_CARE_PROVIDER_SITE_OTHER): Payer: PRIVATE HEALTH INSURANCE | Admitting: Nurse Practitioner

## 2019-06-27 VITALS — BP 138/80 | HR 66 | Ht 74.0 in | Wt 227.4 lb

## 2019-06-27 DIAGNOSIS — E782 Mixed hyperlipidemia: Secondary | ICD-10-CM | POA: Diagnosis not present

## 2019-06-27 DIAGNOSIS — R9431 Abnormal electrocardiogram [ECG] [EKG]: Secondary | ICD-10-CM

## 2019-06-27 DIAGNOSIS — I10 Essential (primary) hypertension: Secondary | ICD-10-CM | POA: Diagnosis not present

## 2019-06-27 DIAGNOSIS — R0789 Other chest pain: Secondary | ICD-10-CM

## 2019-06-27 NOTE — Patient Instructions (Addendum)
After Visit Summary:  We will be checking the following labs today - NONE   Medication Instructions:    Continue with your current medicines for now - ok to stay off the Valsartan while we do a BP diary over the next few weeks.    If you need a refill on your cardiac medications before your next appointment, please call your pharmacy.     Testing/Procedures To Be Arranged:  N/A  Follow-Up:   See [place] back in a year. You will receive a reminder letter in the mail two months in advance. If you don't receive a letter, please call our office to schedule the follow-up appointment.    Monitor your BP diary over the next several weeks - send [place] a message thru My Chart and we can then decide about continuing your BP medicine.     At [facility], you and your health needs are our priority.  As part of our continuing mission to provide you with exceptional heart care, we have created designated Provider Care Teams.  These Care Teams include your primary Cardiologist (physician) and Advanced Practice Providers (APPs -  Physician Assistants and Nurse Practitioners) who all work together to provide you with the care you need, when you need it.  Special Instructions:  . Stay safe, stay home, wash your hands for at least 20 seconds and wear a mask when out in public.  . It was good to talk with you today.  Marland Kitchen Keep working on losing weight . Restrict your salt . Regular exercise.    Call the [place] office at [phone] if you have any questions, problems or concerns.

## 2020-08-05 NOTE — Progress Notes (Signed)
Cardiology Office Note   Date:  08/07/2020   ID:  Tyler Kelley, DOB 1971/02/12, MRN [id number]  PCP:  Elias Else, MD  Cardiologist: Dr. Verdis Prime, MD  Chief Complaint  Patient presents with  . Follow-up   History of Present Illness: Tyler Kelley is a 49 y.o. male who presents for 1 year follow-up, seen for Dr. Katrinka Blazing.  Tyler Kelley has a history of atypical chest pain and prior abnormal EKG. He underwent coronary calcium scoring 11/27/2019 which showed a calcium score of 0.  He was last seen in follow-up 06/27/2019 at which time he continued to have chronic chest pain with no changes and no exertional qualities. He was staying busy with his 7 kids, 5 of which still remain at home.  He had been out of his valsartan 160 mg for over a week with a stable BP therefore this was not restarted.  Today he states that he has been doing well. He has been tracking his BP at home and reports that SBPs have been elevated in the 145 range. As above he has been off all antihypertensives. He feels that it is time to restart medications. He did well on Valsartan therefore we discussed adding this back to his regime. He recently saw his PCP with full labs that show a normal creatinine. His LDL was noted to be elevated at 127 therefore we discussed adding a statin however he prefers to try diet and exercise modifications first.  He continues to have very vague atypical chest pains which are not exertional and are very intermittent and dull. He has no associated symptoms. Overall doing well and staying busy with his family.   Past Medical History:  Diagnosis Date  . Urticaria   . Vertigo     Past Surgical History:  Procedure Laterality Date  . VASECTOMY      Current Outpatient Medications  Medication Sig Dispense Refill  . ketoconazole (NIZORAL) 2 % cream APPLY 1 APPLICATION TWICE A DAY EXTERNALLY 30 DAYS    . sildenafil (REVATIO) 20 MG tablet Take one tablet by mouth one hour prior to sex  as needed. Do not take more than one dose every 24 hours.    . valsartan (DIOVAN) 160 MG tablet Take 1 tablet (160 mg total) by mouth daily. 90 tablet 3   No current facility-administered medications for this visit.    Allergies:   Azithromycin and Erythromycin base    Social History:  The patient  reports that he has never smoked. He has never used smokeless tobacco. He reports current alcohol use. He reports that he does not use drugs.   Family History:  The patient's family history includes Cancer in his maternal grandmother and mother; Hypertension in his father.    ROS:  Please see the history of present illness.   Otherwise, review of systems are positive for none.  All other systems are reviewed and negative.    PHYSICAL EXAM: VS:  BP (!) 146/80   Pulse 79   Ht 6\' 2"  (1.88 m)   Wt 234 lb (106.1 kg)   SpO2 95%   BMI 30.04 kg/m  , BMI Body mass index is 30.04 kg/m.  General: Well developed, well nourished, NAD Neck: Negative for carotid bruits. No JVD Lungs:Clear to ausculation bilaterally. No wheezes, rales, or rhonchi. Breathing is unlabored. Cardiovascular: RRR with S1 S2. No murmurs, rubs, gallops, or LV heave appreciated. Neuro: Alert and oriented. No focal deficits. No facial asymmetry.  MAE spontaneously. Psych: Responds to questions appropriately with normal affect.     EKG:  EKG is ordered today. The ekg ordered today demonstrates NSR with no change from prior tracing, HR 79bpm    Recent Labs: No results found for requested labs within last 8760 hours.    Lipid Panel    Component Value Date/Time   CHOL 163 11/22/2015 1302   TRIG 247 (H) 11/22/2015 1302   HDL 41 11/22/2015 1302   CHOLHDL 4.0 11/22/2015 1302   VLDL 49 (H) 11/22/2015 1302   LDLCALC 73 11/22/2015 1302     Wt Readings from Last 3 Encounters:  08/07/20 234 lb (106.1 kg)  06/27/19 227 lb 6.4 oz (103.1 kg)  06/17/18 235 lb (106.6 kg)    Other studies Reviewed: Additional studies/  records that were reviewed today include:  Review of the above records demonstrates:   Coronary calcium scoring 11/27/2015: Coronary calcium score of 0.  ASSESSMENT AND PLAN:  1.  Chronic atypical chest pain: -Unchanged, nonexertional -Continue with CV risk factor modification  2.  HTN: -On last follow-up, had been out of valsartan 160 mg for at least a week without significant increase in BP therefore this was not restarted per patient's request -Elevated today at 146/80 with similar home readings therefore we will add back Valsartan 160mg   -Creatinine stable on recent labs with PCP   3.  HLD: -Prior coronary calcium score 0 in 2017 -Last LDL, 123 on recent labs with PCP -Discussed statin therapy for risk reduction however he prefers to attempt reduction with lifestyle modifications first.   4.  Previous abnormal EKG: -EKG today stable with no evidence of acute or old MI. -Normal today   Current medicines are reviewed at length with the patient today.  The patient does not have concerns regarding medicines.  The following changes have been made:  Add valsartan 160mg    Labs/ tests ordered today include: none   Orders Placed This Encounter  Procedures  . EKG 12-Lead   Disposition:   FU with myself in 6 months  Signed, 2018, NP  08/07/2020 2:46 PM    [facility] [address] [place], [place], [place]  [place] Phone: [phone]; Fax: [phone]

## 2020-08-07 ENCOUNTER — Other Ambulatory Visit: Payer: Self-pay

## 2020-08-07 ENCOUNTER — Ambulatory Visit (INDEPENDENT_AMBULATORY_CARE_PROVIDER_SITE_OTHER): Payer: PRIVATE HEALTH INSURANCE | Admitting: Cardiology

## 2020-08-07 ENCOUNTER — Encounter: Payer: Self-pay | Admitting: Cardiology

## 2020-08-07 VITALS — BP 146/80 | HR 79 | Ht 74.0 in | Wt 234.0 lb

## 2020-08-07 DIAGNOSIS — R0789 Other chest pain: Secondary | ICD-10-CM

## 2020-08-07 DIAGNOSIS — E782 Mixed hyperlipidemia: Secondary | ICD-10-CM | POA: Diagnosis not present

## 2020-08-07 DIAGNOSIS — R9431 Abnormal electrocardiogram [ECG] [EKG]: Secondary | ICD-10-CM

## 2020-08-07 MED ORDER — VALSARTAN 160 MG PO TABS: 160.0000 mg | ORAL_TABLET | Freq: Every day | ORAL | 3 refills | Status: DC

## 2020-08-07 NOTE — Patient Instructions (Signed)
Medication Instructions:  Your physician has recommended you make the following change in your medication: 1. RESTART VALSARTAN 160 MG DAILY.  *If you need a refill on your cardiac medications before your next appointment, please call your pharmacy*   Lab Work: NONE If you have labs (blood work) drawn today and your tests are completely normal, you will receive your results only by: Marland Kitchen MyChart Message (if you have MyChart) OR . A paper copy in the mail If you have any lab test that is abnormal or we need to change your treatment, we will call you to review the results.   Testing/Procedures: NONE   Follow-Up: At [facility], you and your health needs are our priority.  As part of our continuing mission to provide you with exceptional heart care, we have created designated Provider Care Teams.  These Care Teams include your primary Cardiologist (physician) and Advanced Practice Providers (APPs -  Physician Assistants and Nurse Practitioners) who all work together to provide you with the care you need, when you need it.  We recommend signing up for the patient portal called "MyChart".  Sign up information is provided on this After Visit Summary.  MyChart is used to connect with patients for Virtual Visits (Telemedicine).  Patients are able to view lab/test results, encounter notes, upcoming appointments, etc.  Non-urgent messages can be sent to your provider as well.   To learn more about what you can do with MyChart, go to [email or web address].    Your next appointment:   6 month(s)  The format for your next appointment:   In Person  Provider:   Georgie Chard, NP

## 2021-09-22 NOTE — Progress Notes (Signed)
Cardiology Office Note:    Date:  09/24/2021   ID:  JENE GUTHERIE, DOB 1971-01-19, MRN [id number]  PCP:  Maury Dus, MD  Cardiologist:  Sinclair Grooms, MD   Referring MD: Maury Dus, MD   Chief Complaint  Patient presents with   Hyperlipidemia   Hypertension    History of Present Illness:    Tyler Kelley is a 51 y.o. male with a hx of chest pain with coronary calcium score 0, hypertension, ECG with LAHB, and hyperlipidemia.   Progressive erectile dysfunction.  He has to use a much higher dose of sildenafil.  Otherwise no complaints.  No chest pain or claudication.  Past Medical History:  Diagnosis Date   Urticaria    Vertigo     Past Surgical History:  Procedure Laterality Date   VASECTOMY      Current Medications: Current Meds  Medication Sig   ketoconazole (NIZORAL) 2 % cream APPLY 1 APPLICATION TWICE A DAY EXTERNALLY 30 DAYS   sildenafil (REVATIO) 20 MG tablet Take one tablet by mouth one hour prior to sex as needed. Do not take more than one dose every 24 hours.   valsartan (DIOVAN) 160 MG tablet Take 1 tablet (160 mg total) by mouth daily.     Allergies:   Azithromycin and Erythromycin base   Social History   Socioeconomic History   Marital status: Married    Spouse name: Not on file   Number of children: Not on file   Years of education: Not on file   Highest education level: Not on file  Occupational History   Not on file  Tobacco Use   Smoking status: Never   Smokeless tobacco: Never  Vaping Use   Vaping Use: Never used  Substance and Sexual Activity   Alcohol use: Yes    Alcohol/week: 0.0 standard drinks   Drug use: No   Sexual activity: Not on file  Other Topics Concern   Not on file  Social History Narrative   Not on file   Social Determinants of Health   Financial Resource Strain: Not on file  Food Insecurity: Not on file  Transportation Needs: Not on file  Physical Activity: Not on file  Stress: Not on file   Social Connections: Not on file     Family History: The patient's family history includes Cancer in his maternal grandmother and mother; Hypertension in his father.  ROS:   Please see the history of present illness.    He is compliant with antihypertensive therapy.  He wonders if this could be contributing to erectile dysfunction.  All other systems reviewed and are negative.  EKGs/Labs/Other Studies Reviewed:    The following studies were reviewed today: No new imaging.  Coronary calcium score 2017 is good for 5 years.  EKG:  EKG rhythm, left anterior hemiblock no change compared to November 2021.  Recent Labs: No results found for requested labs within last 8760 hours.  Recent Lipid Panel    Component Value Date/Time   CHOL 163 11/22/2015 1302   TRIG 247 (H) 11/22/2015 1302   HDL 41 11/22/2015 1302   CHOLHDL 4.0 11/22/2015 1302   VLDL 49 (H) 11/22/2015 1302   LDLCALC 73 11/22/2015 1302    Physical Exam:    VS:  BP 118/80    Pulse 72    Ht 6\' 2"  (1.88 m)    Wt 238 lb (108 kg)    SpO2 96%    BMI 30.56 kg/m  Wt Readings from Last 3 Encounters:  09/24/21 238 lb (108 kg)  08/07/20 234 lb (106.1 kg)  06/27/19 227 lb 6.4 oz (103.1 kg)     GEN: Healthy appearing. No acute distress HEENT: Normal NECK: No JVD. LYMPHATICS: No lymphadenopathy CARDIAC: No murmur. RRR no gallop, or edema. VASCULAR:  Normal Pulses. No bruits. RESPIRATORY:  Clear to auscultation without rales, wheezing or rhonchi  ABDOMEN: Soft, non-tender, non-distended, No pulsatile mass, MUSCULOSKELETAL: No deformity  SKIN: Warm and dry NEUROLOGIC:  Alert and oriented x 3 PSYCHIATRIC:  Normal affect   ASSESSMENT:    1. Primary hypertension   2. Abnormal EKG   3. Mixed hyperlipidemia   4. Vasculogenic erectile dysfunction, unspecified vasculogenic erectile dysfunction type   5. Chest discomfort    PLAN:    In order of problems listed above:  Excellent control, low-salt diet, exercise,  continue Diovan. No change when compared to prior LDL is 120 a year ago.  Consider starting therapy statin therapy. Consider starting statin therapy.  Erectile dysfunction could be related to endothelial dysfunction.  He will discuss this concept with Dr. Mariea Clonts. No recurrence of chest pain.   Medication Adjustments/Labs and Tests Ordered: Current medicines are reviewed at length with the patient today.  Concerns regarding medicines are outlined above.  Orders Placed This Encounter  Procedures   CT CARDIAC SCORING (SELF PAY ONLY)   EKG 12-Lead   No orders of the defined types were placed in this encounter.   Patient Instructions  Medication Instructions:  Your physician recommends that you continue on your current medications as directed. Please refer to the Current Medication list given to you today.  *If you need a refill on your cardiac medications before your next appointment, please call your pharmacy*   Lab Work: None If you have labs (blood work) drawn today and your tests are completely normal, you will receive your results only by: [place] (if you have MyChart) OR A paper copy in the mail If you have any lab test that is abnormal or we need to change your treatment, we will call you to review the results.   Testing/Procedures: Your physician recommends that you have a Calcium Score performed.   Follow-Up: At [facility], you and your health needs are our priority.  As part of our continuing mission to provide you with exceptional heart care, we have created designated Provider Care Teams.  These Care Teams include your primary Cardiologist (physician) and Advanced Practice Providers (APPs -  Physician Assistants and Nurse Practitioners) who all work together to provide you with the care you need, when you need it.  We recommend signing up for the patient portal called "MyChart".  Sign up information is provided on this After Visit Summary.  MyChart is used to  connect with patients for Virtual Visits (Telemedicine).  Patients are able to view lab/test results, encounter notes, upcoming appointments, etc.  Non-urgent messages can be sent to your provider as well.   To learn more about what you can do with MyChart, go to [email or web address].    Your next appointment:   1 year(s)  The format for your next appointment:   In Person  Provider:   Sinclair Grooms, MD     Other Instructions     Signed, Sinclair Grooms, MD  09/24/2021 4:16 PM    [place]

## 2021-09-24 ENCOUNTER — Ambulatory Visit (INDEPENDENT_AMBULATORY_CARE_PROVIDER_SITE_OTHER): Payer: PRIVATE HEALTH INSURANCE | Admitting: Interventional Cardiology

## 2021-09-24 ENCOUNTER — Other Ambulatory Visit: Payer: Self-pay

## 2021-09-24 ENCOUNTER — Encounter: Payer: Self-pay | Admitting: Interventional Cardiology

## 2021-09-24 VITALS — BP 118/80 | HR 72 | Ht 74.0 in | Wt 238.0 lb

## 2021-09-24 DIAGNOSIS — R0789 Other chest pain: Secondary | ICD-10-CM

## 2021-09-24 DIAGNOSIS — N529 Male erectile dysfunction, unspecified: Secondary | ICD-10-CM | POA: Diagnosis not present

## 2021-09-24 DIAGNOSIS — I10 Essential (primary) hypertension: Secondary | ICD-10-CM | POA: Diagnosis not present

## 2021-09-24 DIAGNOSIS — E782 Mixed hyperlipidemia: Secondary | ICD-10-CM | POA: Diagnosis not present

## 2021-09-24 DIAGNOSIS — R9431 Abnormal electrocardiogram [ECG] [EKG]: Secondary | ICD-10-CM

## 2021-09-24 NOTE — Patient Instructions (Signed)
Medication Instructions:  Your physician recommends that you continue on your current medications as directed. Please refer to the Current Medication list given to you today.  *If you need a refill on your cardiac medications before your next appointment, please call your pharmacy*   Lab Work: None If you have labs (blood work) drawn today and your tests are completely normal, you will receive your results only by: MyChart Message (if you have MyChart) OR A paper copy in the mail If you have any lab test that is abnormal or we need to change your treatment, we will call you to review the results.   Testing/Procedures: Your physician recommends that you have a Calcium Score performed.   Follow-Up: At [facility], you and your health needs are our priority.  As part of our continuing mission to provide you with exceptional heart care, we have created designated Provider Care Teams.  These Care Teams include your primary Cardiologist (physician) and Advanced Practice Providers (APPs -  Physician Assistants and Nurse Practitioners) who all work together to provide you with the care you need, when you need it.  We recommend signing up for the patient portal called "MyChart".  Sign up information is provided on this After Visit Summary.  MyChart is used to connect with patients for Virtual Visits (Telemedicine).  Patients are able to view lab/test results, encounter notes, upcoming appointments, etc.  Non-urgent messages can be sent to your provider as well.   To learn more about what you can do with MyChart, go to [email or web address].    Your next appointment:   1 year(s)  The format for your next appointment:   In Person  Provider:   Henry W Knutson III, MD     Other Instructions   

## 2021-10-16 ENCOUNTER — Other Ambulatory Visit: Payer: Self-pay | Admitting: Cardiology

## 2021-10-21 DIAGNOSIS — Z13220 Encounter for screening for lipoid disorders: Secondary | ICD-10-CM | POA: Diagnosis not present

## 2021-10-21 DIAGNOSIS — Z125 Encounter for screening for malignant neoplasm of prostate: Secondary | ICD-10-CM | POA: Diagnosis not present

## 2021-10-21 DIAGNOSIS — I10 Essential (primary) hypertension: Secondary | ICD-10-CM | POA: Diagnosis not present

## 2021-10-21 DIAGNOSIS — Z0000 Encounter for general adult medical examination without abnormal findings: Secondary | ICD-10-CM | POA: Diagnosis not present

## 2021-10-24 ENCOUNTER — Ambulatory Visit (INDEPENDENT_AMBULATORY_CARE_PROVIDER_SITE_OTHER)
Admission: RE | Admit: 2021-10-24 | Discharge: 2021-10-24 | Disposition: A | Discharge status: Home / Self Care | Payer: Self-pay | Source: Ambulatory Visit | Attending: Interventional Cardiology | Admitting: Interventional Cardiology

## 2021-10-24 ENCOUNTER — Encounter: Payer: Self-pay | Admitting: Interventional Cardiology

## 2021-10-24 ENCOUNTER — Other Ambulatory Visit: Payer: Self-pay

## 2021-10-24 DIAGNOSIS — E782 Mixed hyperlipidemia: Secondary | ICD-10-CM

## 2021-10-24 DIAGNOSIS — R0789 Other chest pain: Secondary | ICD-10-CM

## 2021-10-29 DIAGNOSIS — R69 Illness, unspecified: Secondary | ICD-10-CM | POA: Diagnosis not present

## 2022-04-17 DIAGNOSIS — R69 Illness, unspecified: Secondary | ICD-10-CM | POA: Diagnosis not present

## 2022-05-02 DIAGNOSIS — R69 Illness, unspecified: Secondary | ICD-10-CM | POA: Diagnosis not present

## 2022-05-06 DIAGNOSIS — K5900 Constipation, unspecified: Secondary | ICD-10-CM | POA: Diagnosis not present

## 2022-05-06 DIAGNOSIS — Z1211 Encounter for screening for malignant neoplasm of colon: Secondary | ICD-10-CM | POA: Diagnosis not present

## 2022-05-06 DIAGNOSIS — E669 Obesity, unspecified: Secondary | ICD-10-CM | POA: Diagnosis not present

## 2022-05-06 DIAGNOSIS — Z8371 Family history of colonic polyps: Secondary | ICD-10-CM | POA: Diagnosis not present

## 2022-05-16 DIAGNOSIS — R69 Illness, unspecified: Secondary | ICD-10-CM | POA: Diagnosis not present

## 2022-05-29 DIAGNOSIS — R69 Illness, unspecified: Secondary | ICD-10-CM | POA: Diagnosis not present

## 2022-07-16 DIAGNOSIS — R69 Illness, unspecified: Secondary | ICD-10-CM | POA: Diagnosis not present

## 2022-08-01 DIAGNOSIS — R69 Illness, unspecified: Secondary | ICD-10-CM | POA: Diagnosis not present

## 2022-08-25 DIAGNOSIS — N5201 Erectile dysfunction due to arterial insufficiency: Secondary | ICD-10-CM | POA: Diagnosis not present

## 2022-10-08 DIAGNOSIS — Z1211 Encounter for screening for malignant neoplasm of colon: Secondary | ICD-10-CM | POA: Diagnosis not present

## 2022-10-27 DIAGNOSIS — N529 Male erectile dysfunction, unspecified: Secondary | ICD-10-CM | POA: Diagnosis not present

## 2022-10-27 DIAGNOSIS — Z125 Encounter for screening for malignant neoplasm of prostate: Secondary | ICD-10-CM | POA: Diagnosis not present

## 2022-10-27 DIAGNOSIS — I10 Essential (primary) hypertension: Secondary | ICD-10-CM | POA: Diagnosis not present

## 2022-10-27 DIAGNOSIS — Z13220 Encounter for screening for lipoid disorders: Secondary | ICD-10-CM | POA: Diagnosis not present

## 2022-10-27 DIAGNOSIS — Z0000 Encounter for general adult medical examination without abnormal findings: Secondary | ICD-10-CM | POA: Diagnosis not present

## 2022-10-28 DIAGNOSIS — R69 Illness, unspecified: Secondary | ICD-10-CM | POA: Diagnosis not present

## 2022-11-06 DIAGNOSIS — F4320 Adjustment disorder, unspecified: Secondary | ICD-10-CM | POA: Diagnosis not present

## 2022-11-26 DIAGNOSIS — F4320 Adjustment disorder, unspecified: Secondary | ICD-10-CM | POA: Diagnosis not present

## 2023-02-16 DIAGNOSIS — F4322 Adjustment disorder with anxiety: Secondary | ICD-10-CM | POA: Diagnosis not present

## 2023-02-23 DIAGNOSIS — F4322 Adjustment disorder with anxiety: Secondary | ICD-10-CM | POA: Diagnosis not present

## 2023-03-09 DIAGNOSIS — F4322 Adjustment disorder with anxiety: Secondary | ICD-10-CM | POA: Diagnosis not present

## 2023-03-11 NOTE — Progress Notes (Unsigned)
Cardiology Office Note:   Date:  03/16/2023  ID:  Tyler Kelley, DOB 1971/03/21, MRN [id number]  History of Present Illness:   Tyler Kelley is a 52 y.o. male with HTN, HLD, and coronary Ca score 0 who was previously followed by Dr. Katrinka Blazing who now presents to clinic for follow-up.  Was last seen in clinic on 09/2021 where he was doing well from a CV standpoint.  Today, the patient states he doing well. No chest pain, SOB, orthopnea, or LE edema. Walks 2-3days per week but not exercising regularly. Plans to start using an elliptical.   Past Medical History:  Diagnosis Date   Urticaria    Vertigo      ROS: As per HPI  Studies Reviewed:    EKG:  NSR, LVH-personally reviewed  Cardiac Studies & Procedures     STRESS TESTS  NM MYOCAR MULTI W/SPECT W 12/29/2013      CT SCANS  CT CARDIAC SCORING (SELF PAY ONLY) 10/24/2021  Addendum 10/24/2021  7:30 PM ADDENDUM REPORT: 10/24/2021 19:28  CLINICAL DATA:  Cardiovascular Disease Risk stratification  EXAM: Coronary Calcium Score  TECHNIQUE: A gated, non-contrast computed tomography scan of the heart was performed using 3mm slice thickness. Axial images were analyzed on a dedicated workstation. Calcium scoring of the coronary arteries was performed using the Agatston method.  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  Coronary Calcium Score:  Left main: 0  Left anterior descending artery: 0  Left circumflex artery: 0  Right coronary artery: 0  Total: 0  Percentile: 0  Pericardium: Normal.  Ascending Aorta: Normal caliber.  Non-cardiac: See separate report from [facility].  IMPRESSION: 1. Coronary calcium score of 0. This was 0 percentile for age-, race-, and sex-matched controls. 2. 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  RECOMMENDATIONS: Coronary artery calcium (CAC) score is a  strong predictor of incident coronary heart disease (CHD) and provides predictive information beyond traditional risk factors. CAC scoring is reasonable to use in the decision to withhold, postpone, or initiate statin therapy in intermediate-risk or selected borderline-risk asymptomatic adults (age 55-75 years and LDL-C >=70 to <190 mg/dL) who do not have diabetes or established atherosclerotic cardiovascular disease (ASCVD).* In intermediate-risk (10-year ASCVD risk >=7.5% to <20%) adults or selected borderline-risk (10-year ASCVD risk >=5% to <7.5%) adults in whom a CAC score is measured for the purpose of making a treatment decision the following recommendations have been made:  If CAC=0, it is reasonable to withhold statin therapy and reassess in 5 to 10 years, as long as higher risk conditions are absent (diabetes mellitus, family history of premature CHD in first degree relatives (males <55 years; females <65 years), cigarette smoking, or LDL >=190 mg/dL).  If CAC is 1 to 99, it is reasonable to initiate statin therapy for patients >=59 years of age.  If CAC is >=100 or >=75th percentile, it is reasonable to initiate statin therapy at any age.  Cardiology referral should be considered for patients with CAC scores >=400 or >=75th percentile.  *2018 AHA/ACC/AACVPR/AAPA/ABC/ACPM/ADA/AGS/APhA/ASPC/NLA/PCNA Guideline on the Management of Blood Cholesterol: A Report of the American College of Cardiology/American Heart Association Task Force on Clinical Practice Guidelines. J Am Coll Cardiol. 2019;73(24):3168-3209.  Zoila Shutter, MD   Electronically Signed By: Chrystie Nose M.D. On: 10/24/2021 19:28  Narrative EXAM: OVER-READ INTERPRETATION  CT CHEST  The following report is an over-read performed by radiologist Dr.  Trudie Reed of [facility], [place] on 10/24/2021. This over-read does not include interpretation of cardiac or coronary anatomy or pathology. The  coronary calcium score/coronary CTA interpretation by the cardiologist is attached.  COMPARISON:  Calcium score examination 11/27/2015.  FINDINGS: Within the visualized portions of the thorax there are no suspicious appearing pulmonary nodules or masses, there is no acute consolidative airspace disease, no pleural effusions, no pneumothorax and no lymphadenopathy. Visualized portions of the upper abdomen are unremarkable. There are no aggressive appearing lytic or blastic lesions noted in the visualized portions of the skeleton.  IMPRESSION: 1. No significant incidental noncardiac findings are noted.  Electronically Signed: By: Trudie Reed M.D. On: 10/24/2021 08:49   CT SCANS  CT CARDIAC SCORING (SELF PAY ONLY) 11/27/2015  Addendum 11/27/2015 12:07 PM ADDENDUM REPORT: 11/27/2015 12:05 CLINICAL DATA:  Risk stratification EXAM: Coronary Calcium Score TECHNIQUE: The patient was scanned on a Siemens Somatom Definition AS 64 slice scanner. Axial non-contrast 3mm slices were carried out through the heart. The data set was analyzed on a dedicated work station and scored using the Agatson method. FINDINGS: Non-cardiac: No significant non cardiac findings on limited lung and soft tissue windows. See separate report from [facility]. Ascending Aorta:  3.7 cm Pericardium: Normal Coronary arteries:  No calcium detected IMPRESSION: Coronary calcium score of 0. Charlton Haws Electronically Signed By: Charlton Haws M.D. On: 11/27/2015 12:05  Narrative EXAM: OVER-READ INTERPRETATION  CT CHEST  The following report is an over-read performed by radiologist Dr. Deberah [facility] on 11/27/2015. This over-read does not include interpretation of cardiac or coronary anatomy or pathology. The coronary calcium score interpretation by the cardiologist is attached.  COMPARISON:  None.  FINDINGS: The visualized portions of the lungs are clear. The  visualized portions of the mediastinum and chest wall are unremarkable.  IMPRESSION: No significant non-cardiac abnormality seen in visualized portion of the thorax.  Electronically Signed: By: Myles Rosenthal M.D. On: 11/27/2015 10:43           Risk Assessment/Calculations:         Physical Exam:   VS:  BP (!) 142/96   Pulse 73   Ht 6\' 2"  (1.88 m)   Wt 254 lb 6.4 oz (115.4 kg)   SpO2 97%   BMI 32.66 kg/m    Wt Readings from Last 3 Encounters:  03/16/23 254 lb 6.4 oz (115.4 kg)  09/24/21 238 lb (108 kg)  08/07/20 234 lb (106.1 kg)     GEN: Well nourished, well developed in no acute distress NECK: No JVD; No carotid bruits CARDIAC: RRR, no murmurs, rubs, gallops RESPIRATORY:  Clear to auscultation without rales, wheezing or rhonchi  ABDOMEN: Soft, non-tender, non-distended EXTREMITIES:  No edema; No deformity   ASSESSMENT AND PLAN:   #HTN: -Elevated today but has not been taking his valsartan -Resume valsartan 160mg  daily -Monitor BP at home with goal <130/90 -Continue lifestyle modifications  #HLD: -LDL 122 -Ca score 0 -Continue with lifestyle modifications        Signed, Meriam Sprague, MD

## 2023-03-12 DIAGNOSIS — F4322 Adjustment disorder with anxiety: Secondary | ICD-10-CM | POA: Diagnosis not present

## 2023-03-16 ENCOUNTER — Ambulatory Visit: Payer: 59 | Attending: Cardiology | Admitting: Cardiology

## 2023-03-16 ENCOUNTER — Encounter: Payer: Self-pay | Admitting: Cardiology

## 2023-03-16 VITALS — BP 142/96 | HR 73 | Ht 74.0 in | Wt 254.4 lb

## 2023-03-16 DIAGNOSIS — I10 Essential (primary) hypertension: Secondary | ICD-10-CM | POA: Diagnosis not present

## 2023-03-16 DIAGNOSIS — E782 Mixed hyperlipidemia: Secondary | ICD-10-CM

## 2023-03-16 NOTE — Patient Instructions (Signed)
Medication Instructions:  Your physician recommends that you continue on your current medications as directed. Please refer to the Current Medication list given to you today.  *If you need a refill on your cardiac medications before your next appointment, please call your pharmacy*   Follow-Up: At [facility], you and your health needs are our priority.  As part of our continuing mission to provide you with exceptional heart care, we have created designated Provider Care Teams.  These Care Teams include your primary Cardiologist (physician) and Advanced Practice Providers (APPs -  Physician Assistants and Nurse Practitioners) who all work together to provide you with the care you need, when you need it.   Your next appointment:   1 year(s)  Provider:   Dr McAlhany   

## 2023-12-07 ENCOUNTER — Encounter: Payer: Self-pay | Admitting: Cardiology

## 2024-03-09 NOTE — Progress Notes (Signed)
     HPI: Follow-up hypertension.  Previously followed by Dr. Claudene, then Dr. Hobart now transitioning to me.  Calcium score February 2023 0.  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  Since last seen the patient denies any dyspnea on exertion, orthopnea, PND, pedal edema, palpitations, syncope or chest pain.   Current Outpatient Medications  Medication Sig Dispense Refill   ketoconazole (NIZORAL) 2 % cream APPLY 1 APPLICATION TWICE A DAY EXTERNALLY 30 DAYS     sildenafil (REVATIO) 20 MG tablet Take one tablet by mouth one hour prior to sex as needed. Do not take more than one dose every 24 hours.     valsartan  (DIOVAN ) 160 MG tablet TAKE ONE TABLET BY MOUTH DAILY 90 tablet 3   No current facility-administered medications for this visit.     Past Medical History:  Diagnosis Date   Urticaria    Vertigo     Past Surgical History:  Procedure Laterality Date   VASECTOMY      Social History   Socioeconomic History   Marital status: Married    Spouse name: Not on file   Number of children: Not on file   Years of education: Not on file   Highest education level: Not on file  Occupational History   Not on file  Tobacco Use   Smoking status: Never   Smokeless tobacco: Never  Vaping Use   Vaping status: Never Used  Substance and Sexual Activity   Alcohol use: Yes    Alcohol/week: 0.0 standard drinks of alcohol   Drug use: No   Sexual activity: Not on file  Other Topics Concern   Not on file  Social History Narrative   Not on file   Social Drivers of Health   Financial Resource Strain: Not on file  Food Insecurity: Not on file  Transportation Needs: Not on file  Physical Activity: Not on file  Stress: Not on file  Social Connections: Not on file  Intimate Partner Violence: Not on file    Family History  Problem Relation Age of Onset   Cancer Mother    Hypertension Father    Cancer Maternal Grandmother     ROS:  no fevers or chills, productive cough, hemoptysis, dysphasia, odynophagia, melena, hematochezia, dysuria, hematuria, rash, seizure activity, orthopnea, PND, pedal edema, claudication. Remaining systems are negative.  Physical Exam: Well-developed well-nourished in no acute distress.  Skin is warm and dry.  HEENT is normal.  Neck is supple.  Chest is clear to auscultation with normal expansion.  Cardiovascular exam is regular rate and rhythm.  Abdominal exam nontender or distended. No masses palpated. Extremities show no edema. neuro grossly intact  EKG Interpretation Date/Time:  Tuesday March 15 2024 08:58:31 EDT Ventricular Rate:  67 PR Interval:  168 QRS Duration:  120 QT Interval:  400 QTC Calculation: 422 R Axis:   -38  Text Interpretation: Normal sinus rhythm Left axis deviation Confirmed by Pietro Rogue ([id number]) on 03/15/2024 8:59:47 AM    A/P  1 hypertension-patient's blood pressure is controlled.  Continue present medical regimen.  2 hyperlipidemia-most recent calcium score 0.  Continue diet.  Rogue Pietro, MD

## 2024-03-15 ENCOUNTER — Encounter: Payer: Self-pay | Admitting: Cardiology

## 2024-03-15 ENCOUNTER — Ambulatory Visit: Payer: PRIVATE HEALTH INSURANCE | Attending: Cardiology | Admitting: Cardiology

## 2024-03-15 VITALS — BP 124/88 | HR 67 | Ht 74.0 in | Wt 248.0 lb

## 2024-03-15 DIAGNOSIS — E782 Mixed hyperlipidemia: Secondary | ICD-10-CM

## 2024-03-15 DIAGNOSIS — I10 Essential (primary) hypertension: Secondary | ICD-10-CM

## 2024-03-15 NOTE — Patient Instructions (Signed)

## 2024-08-09 ENCOUNTER — Other Ambulatory Visit: Payer: Self-pay

## 2024-08-09 ENCOUNTER — Emergency Department (HOSPITAL_BASED_OUTPATIENT_CLINIC_OR_DEPARTMENT_OTHER): Payer: PRIVATE HEALTH INSURANCE | Admitting: Radiology

## 2024-08-09 ENCOUNTER — Emergency Department (HOSPITAL_BASED_OUTPATIENT_CLINIC_OR_DEPARTMENT_OTHER)
Admission: EM | Admit: 2024-08-09 | Discharge: 2024-08-09 | Disposition: A | Discharge status: Home / Self Care | Payer: PRIVATE HEALTH INSURANCE | Attending: Emergency Medicine | Admitting: Emergency Medicine

## 2024-08-09 DIAGNOSIS — M546 Pain in thoracic spine: Secondary | ICD-10-CM | POA: Insufficient documentation

## 2024-08-09 DIAGNOSIS — R079 Chest pain, unspecified: Secondary | ICD-10-CM | POA: Insufficient documentation

## 2024-08-09 DIAGNOSIS — I10 Essential (primary) hypertension: Secondary | ICD-10-CM | POA: Insufficient documentation

## 2024-08-09 LAB — CBC
HCT: 42.1 % (ref 39.0–52.0)
Hemoglobin: 14.4 g/dL (ref 13.0–17.0)
MCH: 30.4 pg (ref 26.0–34.0)
MCHC: 34.2 g/dL (ref 30.0–36.0)
MCV: 88.8 fL (ref 80.0–100.0)
Platelets: 217 K/uL (ref 150–400)
RBC: 4.74 MIL/uL (ref 4.22–5.81)
RDW: 12.7 % (ref 11.5–15.5)
WBC: 8.3 K/uL (ref 4.0–10.5)
nRBC: 0 % (ref 0.0–0.2)

## 2024-08-09 LAB — BASIC METABOLIC PANEL WITH GFR
Anion gap: 16 — ABNORMAL HIGH (ref 5–15)
BUN: 17 mg/dL (ref 6–20)
CO2: 25 mmol/L (ref 22–32)
Calcium: 9.6 mg/dL (ref 8.9–10.3)
Chloride: 100 mmol/L (ref 98–111)
Creatinine, Ser: 1.12 mg/dL (ref 0.61–1.24)
GFR, Estimated: >60 mL/min (ref >60)
Glucose, Bld: 107 mg/dL — ABNORMAL HIGH (ref 70–99)
Potassium: 3.8 mmol/L (ref 3.5–5.1)
Sodium: 140 mmol/L (ref 135–145)

## 2024-08-09 LAB — TROPONIN T, HIGH SENSITIVITY
Troponin T High Sensitivity: <15 ng/L (ref 0–19)
Troponin T High Sensitivity: <15 ng/L (ref 0–19)

## 2024-08-09 MED ORDER — NAPROXEN 250 MG PO TABS
500.0000 mg | ORAL_TABLET | Freq: Once | ORAL | Status: AC
  Administered 2024-08-09: 500 mg via ORAL
  Filled 2024-08-09: qty 2

## 2024-08-09 MED ORDER — NAPROXEN 500 MG PO TABS: 500.0000 mg | ORAL_TABLET | Freq: Two times a day (BID) | ORAL | 0 refills | Status: AC

## 2024-08-09 NOTE — ED Provider Notes (Signed)
 [facility] Emergency Department Provider Note MRN:  [id number]  Arrival date & time: 08/09/24     Chief Complaint   Chest Pain   History of Present Illness   Tyler Kelley is a 53 y.o. year-old male with history of hypertension presenting to the ED with chief complaint of chest pain.  Pain to the left thoracic back this morning, worse with certain movements and positions.  Pain seem to spread to the left chest this evening, here for evaluation.  No shortness of breath, no leg pain or swelling.   Review of Systems  A thorough review of systems was obtained and all systems are negative except as noted in the HPI and PMH.   Patient's Health History    Past Medical History:  Diagnosis Date   Urticaria    Vertigo     Past Surgical History:  Procedure Laterality Date   VASECTOMY      Family History  Problem Relation Age of Onset   Cancer Mother    Hypertension Father    Cancer Maternal Grandmother     Social History   Socioeconomic History   Marital status: Married    Spouse name: Not on file   Number of children: Not on file   Years of education: Not on file   Highest education level: Not on file  Occupational History   Not on file  Tobacco Use   Smoking status: Never   Smokeless tobacco: Never  Vaping Use   Vaping status: Never Used  Substance and Sexual Activity   Alcohol use: Yes    Alcohol/week: 0.0 standard drinks of alcohol   Drug use: No   Sexual activity: Not on file  Other Topics Concern   Not on file  Social History Narrative   Not on file   Social Drivers of Health   Financial Resource Strain: Not on file  Food Insecurity: Not on file  Transportation Needs: Not on file  Physical Activity: Not on file  Stress: Not on file  Social Connections: Not on file  Intimate Partner Violence: Not on file     Physical Exam   Vitals:   08/09/24 0300 08/09/24 0330  BP: 126/87 (!) 137/95  Pulse: (!) 58 68  Resp:    Temp:     SpO2: 94% 96%    CONSTITUTIONAL: Well-appearing, NAD NEURO/PSYCH:  Alert and oriented x 3, no focal deficits EYES:  eyes equal and reactive ENT/NECK:  no LAD, no JVD CARDIO: Regular rate, well-perfused, normal S1 and S2 PULM:  CTAB no wheezing or rhonchi GI/GU:  non-distended, non-tender MSK/SPINE:  No gross deformities, no edema SKIN:  no rash, atraumatic   *Additional and/or pertinent findings included in MDM below  Diagnostic and Interventional Summary    EKG Interpretation Date/Time:  Tuesday August 09 2024 00:23:25 EST Ventricular Rate:  75 PR Interval:  156 QRS Duration:  102 QT Interval:  384 QTC Calculation: 428 R Axis:   -34  Text Interpretation: Normal sinus rhythm Left axis deviation Abnormal ECG When compared with ECG of 15-Mar-2024 08:58, No significant change was found Confirmed by Theadore Sharper [phone]) on 08/09/2024 3:05:52 AM       Labs Reviewed  BASIC METABOLIC PANEL WITH GFR - Abnormal; Notable for the following components:      Result Value   Glucose, Bld 107 (*)    Anion gap 16 (*)    All other components within normal limits  CBC  TROPONIN T, HIGH SENSITIVITY  TROPONIN T, HIGH SENSITIVITY    DG Chest 2 View  Final Result      Medications  naproxen  (NAPROSYN ) tablet 500 mg (500 mg Oral Given 08/09/24 0343)     Procedures  /  Critical Care Procedures  ED Course and Medical Decision Making  Initial Impression and Ddx Exam is highly suspicious for MSK related pain.  Tender to the left paraspinal thoracic musculature with reproducible pain.  Suspect some radiation of pain along the intercostals.  Other considerations include ACS, PE though much less likely.  Patient is without tachycardia or hypoxia or shortness of breath or evidence of DVT on exam.  Past medical/surgical history that increases complexity of ED encounter: Hypertension  Interpretation of Diagnostics I personally reviewed the EKG and my interpretation is as follows: Sinus  rhythm without concerning ischemic findings  No significant blood count or electrolyte disturbance.  Troponin negative x 2.  Chest x-ray unremarkable.  Patient Reassessment and Ultimate Disposition/Management     Discharge  Patient management required discussion with the following services or consulting groups:  None  Complexity of Problems Addressed Acute illness or injury that poses threat of life of bodily function  Additional Data Reviewed and Analyzed Further history obtained from: None  Additional Factors Impacting ED Encounter Risk Consideration of hospitalization  Ozell HERO. Theadore, MD [facility] Emergency Medicine [facility] [email or web address]  Final Clinical Impressions(s) / ED Diagnoses     ICD-10-CM   1. Chest pain, unspecified type  R07.9       ED Discharge Orders          Ordered    naproxen  (NAPROSYN ) 500 MG tablet  2 times daily        08/09/24 0343             Discharge Instructions Discussed with and Provided to Patient:    Discharge Instructions      You were evaluated in the Emergency Department and after careful evaluation, we did not find any emergent condition requiring admission or further testing in the hospital.  Your exam/testing today is overall reassuring.  Symptoms likely due to muscular strain or spasm.  Recommend use of the Naprosyn  anti-inflammatory twice daily for pain.  Stretching and regular exercise may help as well.  Follow-up with your primary care doctor.  Please return to the Emergency Department if you experience any worsening of your condition.   Thank you for allowing us  to be a part of your care.      Theadore Ozell HERO, MD 08/09/24 [phone]

## 2024-08-09 NOTE — Discharge Instructions (Addendum)
 You were evaluated in the Emergency Department and after careful evaluation, we did not find any emergent condition requiring admission or further testing in the hospital.  Your exam/testing today is overall reassuring.  Symptoms likely due to muscular strain or spasm.  Recommend use of the Naprosyn  anti-inflammatory twice daily for pain.  Stretching and regular exercise may help as well.  Follow-up with your primary care doctor.  Please return to the Emergency Department if you experience any worsening of your condition.   Thank you for allowing us  to be a part of your care.

## 2024-08-09 NOTE — ED Triage Notes (Signed)
 Complains of left chest and shoulder pain. Constant, sharp. Denies SOB. -N/-V.
